# Patient Record
Sex: Female | Born: 1980 | Race: Black or African American | Hispanic: No | Marital: Single | Smoking: Never smoker
Health system: Southern US, Community
[De-identification: ages and names within clinical notes are randomized; demographics above are authoritative.]

## PROBLEM LIST (undated history)

## (undated) ENCOUNTER — Inpatient Hospital Stay (HOSPITAL_COMMUNITY): Payer: Self-pay

## (undated) DIAGNOSIS — D649 Anemia, unspecified: Secondary | ICD-10-CM

## (undated) DIAGNOSIS — Z9289 Personal history of other medical treatment: Secondary | ICD-10-CM

## (undated) DIAGNOSIS — I10 Essential (primary) hypertension: Secondary | ICD-10-CM

## (undated) DIAGNOSIS — K219 Gastro-esophageal reflux disease without esophagitis: Secondary | ICD-10-CM

## (undated) HISTORY — DX: Essential (primary) hypertension: I10

## (undated) HISTORY — PX: THERAPEUTIC ABORTION: SHX798

---

## 2003-12-06 ENCOUNTER — Emergency Department (HOSPITAL_COMMUNITY): Admission: EM | Admit: 2003-12-06 | Discharge: 2003-12-06 | Payer: Self-pay | Admitting: Emergency Medicine

## 2015-03-12 LAB — OB RESULTS CONSOLE ANTIBODY SCREEN: Antibody Screen: NEGATIVE

## 2015-03-12 LAB — OB RESULTS CONSOLE RPR: RPR: NONREACTIVE

## 2015-03-12 LAB — OB RESULTS CONSOLE HIV ANTIBODY (ROUTINE TESTING): HIV: NONREACTIVE

## 2015-03-12 LAB — OB RESULTS CONSOLE GC/CHLAMYDIA
Chlamydia: NEGATIVE
GC PROBE AMP, GENITAL: NEGATIVE

## 2015-03-12 LAB — OB RESULTS CONSOLE ABO/RH: RH TYPE: POSITIVE

## 2015-03-12 LAB — OB RESULTS CONSOLE RUBELLA ANTIBODY, IGM: Rubella: IMMUNE

## 2015-03-12 LAB — OB RESULTS CONSOLE HEPATITIS B SURFACE ANTIGEN: Hepatitis B Surface Ag: NEGATIVE

## 2015-03-26 ENCOUNTER — Ambulatory Visit (INDEPENDENT_AMBULATORY_CARE_PROVIDER_SITE_OTHER): Payer: 59 | Admitting: Family Medicine

## 2015-03-26 VITALS — BP 138/76 | HR 97 | Temp 98.8°F | Resp 18 | Ht 69.0 in | Wt 242.0 lb

## 2015-03-26 DIAGNOSIS — Z3201 Encounter for pregnancy test, result positive: Secondary | ICD-10-CM | POA: Diagnosis not present

## 2015-03-26 DIAGNOSIS — G43A0 Cyclical vomiting, not intractable: Secondary | ICD-10-CM

## 2015-03-26 DIAGNOSIS — R1115 Cyclical vomiting syndrome unrelated to migraine: Secondary | ICD-10-CM

## 2015-03-26 DIAGNOSIS — R109 Unspecified abdominal pain: Secondary | ICD-10-CM

## 2015-03-26 DIAGNOSIS — Z3481 Encounter for supervision of other normal pregnancy, first trimester: Secondary | ICD-10-CM

## 2015-03-26 LAB — POCT CBC
Granulocyte percent: 57.1 %G (ref 37–80)
HCT, POC: 36.9 % — AB (ref 37.7–47.9)
Hemoglobin: 11.5 g/dL — AB (ref 12.2–16.2)
Lymph, poc: 4.1 — AB (ref 0.6–3.4)
MCH, POC: 23 pg — AB (ref 27–31.2)
MCHC: 31.1 g/dL — AB (ref 31.8–35.4)
MCV: 73.9 fL — AB (ref 80–97)
MID (cbc): 0.2 (ref 0–0.9)
MPV: 7.6 fL (ref 0–99.8)
POC Granulocyte: 5.7 (ref 2–6.9)
POC LYMPH PERCENT: 41.4 %L (ref 10–50)
POC MID %: 1.5 %M (ref 0–12)
Platelet Count, POC: 313 10*3/uL (ref 142–424)
RBC: 4.99 M/uL (ref 4.04–5.48)
RDW, POC: 14.5 %
WBC: 10 10*3/uL (ref 4.6–10.2)

## 2015-03-26 LAB — POCT URINALYSIS DIPSTICK
Bilirubin, UA: NEGATIVE
Glucose, UA: NEGATIVE
Ketones, UA: NEGATIVE
Leukocytes, UA: NEGATIVE
Nitrite, UA: NEGATIVE
Protein, UA: NEGATIVE
Spec Grav, UA: >=1.03
Urobilinogen, UA: 0.2
pH, UA: 5.5

## 2015-03-26 LAB — POCT URINE PREGNANCY: Preg Test, Ur: POSITIVE — AB

## 2015-03-26 LAB — POCT UA - MICROSCOPIC ONLY
Casts, Ur, LPF, POC: NEGATIVE
Crystals, Ur, HPF, POC: NEGATIVE
Yeast, UA: NEGATIVE

## 2015-03-26 MED ORDER — ONDANSETRON 4 MG PO TBDP: 8.0000 mg | ORAL_TABLET | Freq: Once | ORAL | Status: DC

## 2015-03-26 MED ORDER — ONDANSETRON 8 MG PO TBDP: 8.0000 mg | ORAL_TABLET | Freq: Three times a day (TID) | ORAL | Status: DC | PRN

## 2015-03-26 MED ORDER — RANITIDINE HCL 150 MG PO TABS: 150.0000 mg | ORAL_TABLET | Freq: Two times a day (BID) | ORAL | Status: DC

## 2015-03-26 NOTE — Progress Notes (Addendum)
This chart was scribed for [name], MD by Stann Ore, medical scribe at [facility].The patient was seen in exam room 4 and the patient's care was started at 8:45 PM.  Patient ID: [name] MRN: [id number], DOB: [birth year], 34 y.o. Date of Encounter: 03/26/2015  Primary Physician: No primary care provider on file.  Chief Complaint:  Chief Complaint  Patient presents with  . Abdominal Pain    started last friday   . Sore Throat  . Headache  . Emesis    HPI:  [name] is a 34 y.o. female who presents to [facility] complaining of multiple concerns.  Pt complains bad abdominal pains that started since last week. She noticed that she can't hold anything down and would vomit a lot with some sore throat. She went to a [place] buffet last week, and since then, she's noticed the pains. She's also noticed some decreased urination. She denies trying zantac. She denies checking for fever. She denies trouble swallowing and vaginal bleeding.   She is 13 weeks into her pregnancy. Her current OB is in [place] and they told her to come to an Urgent Care.  She moved here to [place] recently.   Past Medical History  Diagnosis Date  . Hypertension      Home Meds: Prior to Admission medications   Medication Sig Start Date End Date Taking? Authorizing Provider  Cholecalciferol (VITAMIN D PO) Take by mouth.   Yes Historical Provider, MD    Allergies: No Known Allergies  Social History   Social History  . Marital Status: Single    Spouse Name: N/A  . Number of Children: N/A  . Years of Education: N/A   Occupational History  . Not on file.   Social History Main Topics  . Smoking status: Never Smoker   . Smokeless tobacco: Not on file  . Alcohol Use: No  . Drug Use: No  . Sexual Activity:    Partners: Male   Other Topics Concern  . Not on file   Social History Narrative  . No narrative on file     Review of  Systems: Constitutional: negative for chills, fever, night sweats, weight changes; positive for fatigue  HEENT: negative for vision changes, hearing loss, congestion, rhinorrhea, ST, epistaxis, or sinus pressure Cardiovascular: negative for chest pain or palpitations Respiratory: negative for hemoptysis, wheezing, shortness of breath, or cough Abdominal: negative for nausea, diarrhea, or constipation; positive for abdominal pain, vomiting  Dermatological: negative for rash GU: negative for vaginal bleeding; positive for decreased urination Neurologic: negative for headache, dizziness, or syncope All other systems reviewed and are otherwise negative with the exception to those above and in the HPI.  Physical Exam: Blood pressure 138/76, pulse 97, temperature 98.8 F (37.1 C), temperature source Oral, resp. rate 18, height  (1.753 m), weight 242 lb (109.77 kg), SpO2 98 %., Body mass index is 35.72 kg/(m^2). General: Well developed, well nourished, in no acute distress. Head: Normocephalic, atraumatic, eyes without discharge, sclera non-icteric, nares are without discharge. Bilateral auditory canals clear, TM's are without perforation, pearly grey and translucent with reflective cone of light bilaterally. Oral cavity moist, posterior pharynx without exudate, erythema, peritonsillar abscess, or post nasal drip.  Neck: Supple. No thyromegaly. Full ROM. No lymphadenopathy. Lungs: Clear bilaterally to auscultation without wheezes, rales, or rhonchi. Breathing is unlabored. Heart: RRR with S1 S2. No murmurs, rubs, or gallops appreciated. Abdomen: Soft, non-tender, non-distended with normoactive bowel sounds.  No hepatomegaly. No rebound/guarding. No obvious abdominal masses. Msk:  Strength and tone normal for age. Extremities/Skin: Warm and dry. No clubbing or cyanosis. No edema. No rashes or suspicious lesions. Neuro: Alert and oriented X 3. Moves all extremities spontaneously. Gait is normal.  CNII-XII grossly in tact. Psych:  Responds to questions appropriately with a normal affect.   Labs: Results for orders placed or performed in visit on 03/26/15  POCT CBC  Result Value Ref Range   WBC 10.0 4.6 - 10.2 K/uL   Lymph, poc 4.1 (A) 0.6 - 3.4   POC LYMPH PERCENT 41.4 10 - 50 %L   MID (cbc) 0.2 0 - 0.9   POC MID % 1.5 0 - 12 %M   POC Granulocyte 5.7 2 - 6.9   Granulocyte percent 57.1 37 - 80 %G   RBC 4.99 4.04 - 5.48 M/uL   Hemoglobin 11.5 (A) 12.2 - 16.2 g/dL   HCT, POC 16.1 (A) 09.6 - 47.9 %   MCV 73.9 (A) 80 - 97 fL   MCH, POC 23.0 (A) 27 - 31.2 pg   MCHC 31.1 (A) 31.8 - 35.4 g/dL   RDW, POC 04.5 %   Platelet Count, POC 313 142 - 424 K/uL   MPV 7.6 0 - 99.8 fL  POCT urinalysis dipstick  Result Value Ref Range   Color, UA yellow    Clarity, UA cloudy    Glucose, UA neg    Bilirubin, UA neg    Ketones, UA neg    Spec Grav, UA >=1.030    Blood, UA trace    pH, UA 5.5    Protein, UA neg    Urobilinogen, UA 0.2    Nitrite, UA neg    Leukocytes, UA Negative Negative  POCT urine pregnancy  Result Value Ref Range   Preg Test, Ur Positive (A) Negative  POCT UA - Microscopic Only  Result Value Ref Range   WBC, Ur, HPF, POC 4-8    RBC, urine, microscopic 0-2    Bacteria, U Microscopic 2+    Mucus, UA trace    Epithelial cells, urine per micros 5-8    Crystals, Ur, HPF, POC neg    Casts, Ur, LPF, POC neg    Yeast, UA neg      ASSESSMENT AND PLAN:  34 y.o. year old female with  This chart was scribed in my presence and reviewed by me personally.    ICD-9-CM ICD-10-CM   1. Abdominal cramps 789.00 R10.9 POCT CBC     POCT urinalysis dipstick     POCT UA - Microscopic Only     ondansetron (ZOFRAN-ODT) 8 MG disintegrating tablet     ranitidine (ZANTAC) 150 MG tablet     Urine culture  2. Encounter for supervision of other normal pregnancy in first trimester V22.1 Z34.81 POCT urine pregnancy     POCT UA - Microscopic Only     ondansetron (ZOFRAN-ODT) 8 MG  disintegrating tablet     ranitidine (ZANTAC) 150 MG tablet  3. Non-intractable cyclical vomiting with nausea 536.2 G43.A0 ondansetron (ZOFRAN-ODT) disintegrating tablet 8 mg     POCT UA - Microscopic Only     ondansetron (ZOFRAN-ODT) 8 MG disintegrating tablet     ranitidine (ZANTAC) 150 MG tablet    Signed, [name], MD 03/26/2015 9:06 PM

## 2015-03-28 LAB — URINE CULTURE
Colony Count: NO GROWTH
Organism ID, Bacteria: NO GROWTH

## 2015-04-21 ENCOUNTER — Encounter (HOSPITAL_COMMUNITY): Payer: Self-pay | Admitting: *Deleted

## 2015-04-21 ENCOUNTER — Inpatient Hospital Stay (HOSPITAL_COMMUNITY)
Admission: AD | Admit: 2015-04-21 | Discharge: 2015-04-21 | Disposition: A | Discharge status: Home / Self Care | Payer: 59 | Source: Ambulatory Visit | Attending: Obstetrics and Gynecology | Admitting: Obstetrics and Gynecology

## 2015-04-21 DIAGNOSIS — Z3A16 16 weeks gestation of pregnancy: Secondary | ICD-10-CM | POA: Diagnosis not present

## 2015-04-21 DIAGNOSIS — R51 Headache: Secondary | ICD-10-CM | POA: Insufficient documentation

## 2015-04-21 DIAGNOSIS — R519 Headache, unspecified: Secondary | ICD-10-CM

## 2015-04-21 DIAGNOSIS — R112 Nausea with vomiting, unspecified: Secondary | ICD-10-CM | POA: Diagnosis present

## 2015-04-21 DIAGNOSIS — O219 Vomiting of pregnancy, unspecified: Secondary | ICD-10-CM

## 2015-04-21 DIAGNOSIS — O26892 Other specified pregnancy related conditions, second trimester: Secondary | ICD-10-CM | POA: Insufficient documentation

## 2015-04-21 DIAGNOSIS — I10 Essential (primary) hypertension: Secondary | ICD-10-CM | POA: Diagnosis not present

## 2015-04-21 LAB — URINALYSIS, ROUTINE W REFLEX MICROSCOPIC
Bilirubin Urine: NEGATIVE
GLUCOSE, UA: NEGATIVE mg/dL
Hgb urine dipstick: NEGATIVE
KETONES UR: NEGATIVE mg/dL
LEUKOCYTES UA: NEGATIVE
NITRITE: NEGATIVE
PH: 6 (ref 5.0–8.0)
Protein, ur: NEGATIVE mg/dL
Urobilinogen, UA: 0.2 mg/dL (ref 0.0–1.0)

## 2015-04-21 MED ORDER — PROMETHAZINE HCL 25 MG PO TABS: 12.5000 mg | ORAL_TABLET | Freq: Four times a day (QID) | ORAL | Status: DC | PRN

## 2015-04-21 MED ORDER — DEXTROSE IN LACTATED RINGERS 5 % IV SOLN
25.0000 mg | Freq: Once | INTRAVENOUS | Status: AC
  Administered 2015-04-21: 25 mg via INTRAVENOUS
  Filled 2015-04-21: qty 1

## 2015-04-21 NOTE — Discharge Instructions (Signed)

## 2015-04-21 NOTE — MAU Note (Signed)
Pt states she has been vomiting for the last 2 days, also reports a headache for the last 2 days.

## 2015-04-21 NOTE — MAU Provider Note (Signed)
History     CSN: [id number]  Arrival date and time: 04/21/15 0040   First Provider Initiated Contact with Patient 04/21/15 0136      No chief complaint on file.  HPI Comments: [name] is a 34 y.o. [id number] at [redacted]w[redacted]d who presents today with nausea/vomiting/headache. She states that she has been given zofran, but she has not been taking. She took tylenol about 5 hours ago, but it didn't help. She states that she was seen in the ED in [facility], and was given IV meds for a similar headache a few weeks ago. She states that it worked, but she doesn't remember what the medication was.   Emesis  This is a new problem. The current episode started more than 1 month ago. The problem occurs 2 to 4 times per day. The problem has been unchanged. The emesis has an appearance of stomach contents. There has been no fever. Associated symptoms include headaches. Pertinent negatives include no abdominal pain, diarrhea or fever. Risk factors: pregnancy  She has tried nothing for the symptoms.    Past Medical History  Diagnosis Date  . Hypertension     Past Surgical History  Procedure Laterality Date  . Cesarean section    . Therapeutic abortion      X4    Family History  Problem Relation Age of Onset  . Diabetes Mother   . Hypertension Mother   . Hypertension Sister     Social History  Substance Use Topics  . Smoking status: Never Smoker   . Smokeless tobacco: None  . Alcohol Use: No    Allergies: No Known Allergies  Facility-administered medications prior to admission  Medication Dose Route Frequency Provider Last Rate Last Dose  . ondansetron (ZOFRAN-ODT) disintegrating tablet 8 mg  8 mg Oral Once [name], MD       Prescriptions prior to admission  Medication Sig Dispense Refill Last Dose  . Cholecalciferol (VITAMIN D PO) Take by mouth.   04/20/2015 at 1400  . ondansetron (ZOFRAN-ODT) 8 MG disintegrating tablet Take 1 tablet (8 mg total) by mouth every 8 (eight) hours  as needed for nausea. 30 tablet 0 Past Month at Unknown time  . ranitidine (ZANTAC) 150 MG tablet Take 1 tablet (150 mg total) by mouth 2 (two) times daily. 30 tablet 0 04/20/2015 at 1400    Review of Systems  Constitutional: Negative for fever.  Gastrointestinal: Positive for nausea and vomiting. Negative for abdominal pain, diarrhea and constipation.  Genitourinary: Negative for dysuria, urgency and frequency.  Neurological: Positive for headaches.   Physical Exam   Blood pressure 133/72, pulse 81, temperature 98.3 F (36.8 C), temperature source Oral, resp. rate 16, height  (1.753 m), weight 111.131 kg (245 lb), last menstrual period 12/27/2014, SpO2 100 %.  Physical Exam  Nursing note and vitals reviewed. Constitutional: She is oriented to person, place, and time. She appears well-developed and well-nourished. No distress.  HENT:  Head: Normocephalic.  Cardiovascular: Normal rate.   Respiratory: Effort normal.  GI: Soft. There is no tenderness.  Neurological: She is alert and oriented to person, place, and time.  Skin: Skin is warm and dry.  Psychiatric: She has a normal mood and affect.   Results for orders placed or performed during the hospital encounter of 04/21/15 (from the past 24 hour(s))  Urinalysis, Routine w reflex microscopic (not at [facility])     Status: Abnormal   Collection Time: 04/21/15 12:55 AM  Result Value Ref Range  Color, Urine YELLOW YELLOW   APPearance CLEAR CLEAR   Specific Gravity, Urine >1.030 (H) 1.005 - 1.030   pH 6.0 5.0 - 8.0   Glucose, UA NEGATIVE NEGATIVE mg/dL   Hgb urine dipstick NEGATIVE NEGATIVE   Bilirubin Urine NEGATIVE NEGATIVE   Ketones, ur NEGATIVE NEGATIVE mg/dL   Protein, ur NEGATIVE NEGATIVE mg/dL   Urobilinogen, UA 0.2 0.0 - 1.0 mg/dL   Nitrite NEGATIVE NEGATIVE   Leukocytes, UA NEGATIVE NEGATIVE    MAU Course  Procedures  MDM Patient has had 1L D5LR and phenergan. She states that her headache and nausea have improved.  She is tolerating PO   Assessment and Plan   1. Nausea/vomiting in pregnancy   2. Acute nonintractable headache, unspecified headache type    DC home Comfort measures reviewed  2nd Trimester precautions  RX: phenergan PRN #[id number]  Return to MAU as needed   Follow-up Information    Follow up with Meriel Pica, MD.   Specialty:  Obstetrics and Gynecology   Why:  As scheduled   Contact information:   [address] [place] [address] [phone]         Tawnya Crook 04/21/2015, 1:37 AM

## 2015-06-23 ENCOUNTER — Inpatient Hospital Stay (HOSPITAL_COMMUNITY): Payer: 59

## 2015-06-23 ENCOUNTER — Encounter (HOSPITAL_COMMUNITY): Payer: Self-pay | Admitting: *Deleted

## 2015-06-23 ENCOUNTER — Inpatient Hospital Stay (HOSPITAL_COMMUNITY)
Admission: AD | Admit: 2015-06-23 | Discharge: 2015-06-23 | Disposition: A | Discharge status: Home / Self Care | Payer: 59 | Source: Ambulatory Visit | Attending: Obstetrics and Gynecology | Admitting: Obstetrics and Gynecology

## 2015-06-23 DIAGNOSIS — R51 Headache: Secondary | ICD-10-CM

## 2015-06-23 DIAGNOSIS — I10 Essential (primary) hypertension: Secondary | ICD-10-CM

## 2015-06-23 DIAGNOSIS — O10912 Unspecified pre-existing hypertension complicating pregnancy, second trimester: Secondary | ICD-10-CM | POA: Diagnosis not present

## 2015-06-23 DIAGNOSIS — R002 Palpitations: Secondary | ICD-10-CM | POA: Insufficient documentation

## 2015-06-23 DIAGNOSIS — R0602 Shortness of breath: Secondary | ICD-10-CM | POA: Diagnosis not present

## 2015-06-23 DIAGNOSIS — Z3A25 25 weeks gestation of pregnancy: Secondary | ICD-10-CM | POA: Insufficient documentation

## 2015-06-23 DIAGNOSIS — R519 Headache, unspecified: Secondary | ICD-10-CM

## 2015-06-23 LAB — COMPREHENSIVE METABOLIC PANEL
ALT: 24 U/L (ref 14–54)
AST: 21 U/L (ref 15–41)
Albumin: 2.8 g/dL — ABNORMAL LOW (ref 3.5–5.0)
Alkaline Phosphatase: 77 U/L (ref 38–126)
Anion gap: 6 (ref 5–15)
BUN: 9 mg/dL (ref 6–20)
CO2: 24 mmol/L (ref 22–32)
Calcium: 8.9 mg/dL (ref 8.9–10.3)
Chloride: 104 mmol/L (ref 101–111)
Creatinine, Ser: 0.55 mg/dL (ref 0.44–1.00)
GFR calc Af Amer: >60 mL/min (ref >60)
GFR calc non Af Amer: >60 mL/min (ref >60)
Glucose, Bld: 86 mg/dL (ref 65–99)
Potassium: 3.8 mmol/L (ref 3.5–5.1)
Sodium: 134 mmol/L — ABNORMAL LOW (ref 135–145)
Total Bilirubin: 0.4 mg/dL (ref 0.3–1.2)
Total Protein: 6.7 g/dL (ref 6.5–8.1)

## 2015-06-23 LAB — CBC
HCT: 35.4 % — ABNORMAL LOW (ref 36.0–46.0)
Hemoglobin: 11.4 g/dL — ABNORMAL LOW (ref 12.0–15.0)
MCH: 23.2 pg — ABNORMAL LOW (ref 26.0–34.0)
MCHC: 32.2 g/dL (ref 30.0–36.0)
MCV: 72.1 fL — ABNORMAL LOW (ref 78.0–100.0)
Platelets: 210 10*3/uL (ref 150–400)
RBC: 4.91 MIL/uL (ref 3.87–5.11)
RDW: 13.8 % (ref 11.5–15.5)
WBC: 10.1 10*3/uL (ref 4.0–10.5)

## 2015-06-23 LAB — URINALYSIS, ROUTINE W REFLEX MICROSCOPIC
BILIRUBIN URINE: NEGATIVE
Glucose, UA: NEGATIVE mg/dL
Hgb urine dipstick: NEGATIVE
KETONES UR: 15 mg/dL — AB
Leukocytes, UA: NEGATIVE
Nitrite: NEGATIVE
PH: 6.5 (ref 5.0–8.0)
PROTEIN: NEGATIVE mg/dL
Specific Gravity, Urine: 1.025 (ref 1.005–1.030)

## 2015-06-23 LAB — PROTEIN / CREATININE RATIO, URINE
Creatinine, Urine: 84 mg/dL
Protein Creatinine Ratio: 0.08 mg/mg{Cre} (ref 0.00–0.15)
Total Protein, Urine: 7 mg/dL

## 2015-06-23 MED ORDER — BUTALBITAL-APAP-CAFFEINE 50-325-40 MG PO TABS
1.0000 | ORAL_TABLET | Freq: Once | ORAL | Status: AC
  Administered 2015-06-23: 1 via ORAL
  Filled 2015-06-23: qty 1

## 2015-06-23 NOTE — MAU Note (Signed)
Sent from MD office for BP elevation & SOB, feels like heart is racing.  Pt states her BP was elevated yesterday & this morning @ home, was normal in MD office.  Has had HA since yesterday.  Has braxton hicks uc's, denies bleeding or LOF.

## 2015-06-23 NOTE — MAU Provider Note (Signed)
History     CSN: [id number]  Arrival date and time: 06/23/15 1525   First Provider Initiated Contact with Patient 06/23/15 1705      Chief Complaint  Patient presents with  . Hypertension  . Shortness of Breath   Hypertension Associated symptoms include headaches, palpitations and shortness of breath.  Shortness of Breath Associated symptoms include headaches.    [name] 34 y.o. [id number] with a history of chronic hypertension on medication, labetolol 100mg  BID, presents to the MAU from the office for evaluation of SOB, and palpitations. She also reports a headache for 3 days.She is to have PRe- E labs today.  Past Medical History  Diagnosis Date  . Hypertension     Past Surgical History  Procedure Laterality Date  . Cesarean section    . Therapeutic abortion      X4    Family History  Problem Relation Age of Onset  . Diabetes Mother   . Hypertension Mother   . Hypertension Sister     Social History  Substance Use Topics  . Smoking status: Never Smoker   . Smokeless tobacco: None  . Alcohol Use: No    Allergies: No Known Allergies  Facility-administered medications prior to admission  Medication Dose Route Frequency Provider Last Rate Last Dose  . ondansetron (ZOFRAN-ODT) disintegrating tablet 8 mg  8 mg Oral Once Elvina SidleKurt Lauenstein, MD       Prescriptions prior to admission  Medication Sig Dispense Refill Last Dose  . acetaminophen (TYLENOL) 500 MG tablet Take 1,000 mg by mouth every 6 (six) hours as needed for mild pain or headache.   06/23/2015 at Unknown time  . Cholecalciferol (VITAMIN D) 2000 UNITS CAPS Take 2,000 Units by mouth daily.   06/23/2015 at Unknown time  . Fe Cbn-Fe Gluc-FA-B12-C-DSS (FERRALET 90 PO) Take 1 tablet by mouth daily.   06/23/2015 at Unknown time  . labetalol (NORMODYNE) 200 MG tablet Take 200 mg by mouth 2 (two) times daily.   06/23/2015 at 1200  . Omeprazole (PRILOSEC PO) Take 1 tablet by mouth daily.   06/23/2015 at  Unknown time  . Prenatal Vit-Min-FA-Fish Oil ([employer] PRENATAL GUMMY PO) Take 2 tablets by mouth daily.   06/23/2015 at Unknown time    Review of Systems  Respiratory: Positive for shortness of breath.   Cardiovascular: Positive for palpitations.  Neurological: Positive for headaches.   Physical Exam   Blood pressure 143/77, pulse 87, temperature 98.5 F (36.9 C), temperature source Oral, resp. rate 20, last menstrual period 12/27/2014, SpO2 100 %.  Physical Exam  Nursing note and vitals reviewed. Constitutional: She is oriented to person, place, and time. She appears well-developed and well-nourished. No distress.  HENT:  Head: Normocephalic and atraumatic.  Neck: Normal range of motion.  Cardiovascular: Normal rate and regular rhythm.   Respiratory: Effort normal and breath sounds normal. No respiratory distress.  GI: Soft. There is no tenderness.  Musculoskeletal: Normal range of motion.  Neurological: She is alert and oriented to person, place, and time.  Skin: Skin is warm and dry.  Psychiatric: She has a normal mood and affect. Her behavior is normal. Judgment and thought content normal.    Results for orders placed or performed during the hospital encounter of 06/23/15 (from the past 24 hour(s))  Urinalysis, Routine w reflex microscopic (not at [facility])     Status: Abnormal   Collection Time: 06/23/15  3:40 PM  Result Value Ref Range   Color, Urine YELLOW YELLOW  APPearance CLEAR CLEAR   Specific Gravity, Urine 1.025 1.005 - 1.030   pH 6.5 5.0 - 8.0   Glucose, UA NEGATIVE NEGATIVE mg/dL   Hgb urine dipstick NEGATIVE NEGATIVE   Bilirubin Urine NEGATIVE NEGATIVE   Ketones, ur 15 (A) NEGATIVE mg/dL   Protein, ur NEGATIVE NEGATIVE mg/dL   Nitrite NEGATIVE NEGATIVE   Leukocytes, UA NEGATIVE NEGATIVE  Protein / creatinine ratio, urine     Status: None   Collection Time: 06/23/15  3:40 PM  Result Value Ref Range   Creatinine, Urine 84.00 mg/dL   Total Protein, Urine 7 mg/dL    Protein Creatinine Ratio 0.08 0.00 - 0.15 mg/mg[Cre]  CBC     Status: Abnormal   Collection Time: 06/23/15  5:14 PM  Result Value Ref Range   WBC 10.1 4.0 - 10.5 K/uL   RBC 4.91 3.87 - 5.11 MIL/uL   Hemoglobin 11.4 (L) 12.0 - 15.0 g/dL   HCT 96.0 (L) 45.4 - 09.8 %   MCV 72.1 (L) 78.0 - 100.0 fL   MCH 23.2 (L) 26.0 - 34.0 pg   MCHC 32.2 30.0 - 36.0 g/dL   RDW 11.9 14.7 - 82.9 %   Platelets 210 150 - 400 K/uL  Comprehensive metabolic panel     Status: Abnormal   Collection Time: 06/23/15  5:14 PM  Result Value Ref Range   Sodium 134 (L) 135 - 145 mmol/L   Potassium 3.8 3.5 - 5.1 mmol/L   Chloride 104 101 - 111 mmol/L   CO2 24 22 - 32 mmol/L   Glucose, Bld 86 65 - 99 mg/dL   BUN 9 6 - 20 mg/dL   Creatinine, Ser 5.62 0.44 - 1.00 mg/dL   Calcium 8.9 8.9 - 13.0 mg/dL   Total Protein 6.7 6.5 - 8.1 g/dL   Albumin 2.8 (L) 3.5 - 5.0 g/dL   AST 21 15 - 41 U/L   ALT 24 14 - 54 U/L   Alkaline Phosphatase 77 38 - 126 U/L   Total Bilirubin 0.4 0.3 - 1.2 mg/dL   GFR calc non Af Amer >60 >60 mL/min   GFR calc Af Amer >60 >60 mL/min   Anion gap 6 5 - 15  CLINICAL DATA: Elevated blood pressure. Shortness of breath. [redacted] weeks pregnant.  EXAM: CHEST 2 VIEW  COMPARISON: None.  FINDINGS: The heart size and mediastinal contours are within normal limits. Both lungs are clear. The visualized skeletal structures are unremarkable.  IMPRESSION: No active cardiopulmonary disease.   Electronically Signed  By: Signa Kell M.D.  On: 06/23/2015 17:44  MAU Course  Procedures  MDM Based on nml lab results, Chest Xray, pt will be discharge tohome and is to wait for office to call her tomorrow for a cardiology consult. Her headache is gone after taking the fioricet and she was advised to take regular tylenol if she needs anything for headache after she is home. FHR- Cat 1, no contractions. Dr Henderson Cloud informed and aware of pt results.  Assessment and Plan   Headache Palpitations  Discharge to home  [facility] 06/23/2015, 6:40 PM

## 2015-06-23 NOTE — Discharge Instructions (Signed)
Palpitations A palpitation is the feeling that your heartbeat is irregular or is faster than normal. It may feel like your heart is fluttering or skipping a beat. Palpitations are usually not a serious problem. However, in some cases, you may need further medical evaluation. CAUSES  Palpitations can be caused by:  Smoking.  Caffeine or other stimulants, such as diet pills or energy drinks.  Alcohol.  Stress and anxiety.  Strenuous physical activity.  Fatigue.  Certain medicines.  Heart disease, especially if you have a history of irregular heart rhythms (arrhythmias), such as atrial fibrillation, atrial flutter, or supraventricular tachycardia.  An improperly working pacemaker or defibrillator. DIAGNOSIS  To find the cause of your palpitations, your health care provider will take your medical history and perform a physical exam. Your health care provider may also have you take a test called an ambulatory electrocardiogram (ECG). An ECG records your heartbeat patterns over a 24-hour period. You may also have other tests, such as:  Transthoracic echocardiogram (TTE). During echocardiography, sound waves are used to evaluate how blood flows through your heart.  Transesophageal echocardiogram (TEE).  Cardiac monitoring. This allows your health care provider to monitor your heart rate and rhythm in real time.  Holter monitor. This is a portable device that records your heartbeat and can help diagnose heart arrhythmias. It allows your health care provider to track your heart activity for several days, if needed.  Stress tests by exercise or by giving medicine that makes the heart beat faster. TREATMENT  Treatment of palpitations depends on the cause of your symptoms and can vary greatly. Most cases of palpitations do not require any treatment other than time, relaxation, and monitoring your symptoms. Other causes, such as atrial fibrillation, atrial flutter, or supraventricular  tachycardia, usually require further treatment. HOME CARE INSTRUCTIONS   Avoid:  Caffeinated coffee, tea, soft drinks, diet pills, and energy drinks.  Chocolate.  Alcohol.  Stop smoking if you smoke.  Reduce your stress and anxiety. Things that can help you relax include:  A method of controlling things in your body, such as your heartbeats, with your mind (biofeedback).  Yoga.  Meditation.  Physical activity such as swimming, jogging, or walking.  Get plenty of rest and sleep. SEEK MEDICAL CARE IF:   You continue to have a fast or irregular heartbeat beyond 24 hours.  Your palpitations occur more often. SEEK IMMEDIATE MEDICAL CARE IF:  You have chest pain or shortness of breath.  You have a severe headache.  You feel dizzy or you faint. MAKE SURE YOU:  Understand these instructions.  Will watch your condition.  Will get help right away if you are not doing well or get worse.   This information is not intended to replace advice given to you by your health care provider. Make sure you discuss any questions you have with your health care provider.   Document Released: 07/01/2000 Document Revised: 07/09/2013 Document Reviewed: 09/02/2011 Elsevier Interactive Patient Education 2016 Elsevier Inc[employer] General Headache Without Cause A headache is pain or discomfort felt around the head or neck area. The specific cause of a headache may not be found. There are many causes and types of headaches. A few common ones are:  Tension headaches.  Migraine headaches.  Cluster headaches.  Chronic daily headaches. HOME CARE INSTRUCTIONS  Watch your condition for any changes. Take these steps to help with your condition: Managing Pain  Take over-the-counter and prescription medicines only as told by your health care provider.  Lorenz CoasterLie  down in a dark, quiet room when you have a headache.  If directed, apply ice to the head and neck area:  Put ice in a plastic bag.  Place a  towel between your skin and the bag.  Leave the ice on for 20 minutes, 2-3 times per day.  Use a heating pad or hot shower to apply heat to the head and neck area as told by your health care provider.  Keep lights dim if bright lights bother you or make your headaches worse. Eating and Drinking  Eat meals on a regular schedule.  Limit alcohol use.  Decrease the amount of caffeine you drink, or stop drinking caffeine. General Instructions  Keep all follow-up visits as told by your health care provider. This is important.  Keep a headache journal to help find out what may trigger your headaches. For example, write down:  What you eat and drink.  How much sleep you get.  Any change to your diet or medicines.  Try massage or other relaxation techniques.  Limit stress.  Sit up straight, and do not tense your muscles.  Do not use tobacco products, including cigarettes, chewing tobacco, or e-cigarettes. If you need help quitting, ask your health care provider.  Exercise regularly as told by your health care provider.  Sleep on a regular schedule. Get 7-9 hours of sleep, or the amount recommended by your health care provider. SEEK MEDICAL CARE IF:   Your symptoms are not helped by medicine.  You have a headache that is different from the usual headache.  You have nausea or you vomit.  You have a fever. SEEK IMMEDIATE MEDICAL CARE IF:   Your headache becomes severe.  You have repeated vomiting.  You have a stiff neck.  You have a loss of vision.  You have problems with speech.  You have pain in the eye or ear.  You have muscular weakness or loss of muscle control.  You lose your balance or have trouble walking.  You feel faint or pass out.  You have confusion.   This information is not intended to replace advice given to you by your health care provider. Make sure you discuss any questions you have with your health care provider.   Document Released:  07/04/2005 Document Revised: 03/25/2015 Document Reviewed: 10/27/2014 Elsevier Interactive Patient Education [employer]

## 2015-06-26 ENCOUNTER — Telehealth: Payer: Self-pay | Admitting: *Deleted

## 2015-06-26 ENCOUNTER — Telehealth: Payer: Self-pay | Admitting: Cardiovascular Disease

## 2015-06-26 ENCOUNTER — Ambulatory Visit (INDEPENDENT_AMBULATORY_CARE_PROVIDER_SITE_OTHER): Payer: 59 | Admitting: Internal Medicine

## 2015-06-26 ENCOUNTER — Encounter: Payer: Self-pay | Admitting: Internal Medicine

## 2015-06-26 VITALS — BP 138/70 | HR 96 | Ht 69.0 in | Wt 250.0 lb

## 2015-06-26 DIAGNOSIS — R0602 Shortness of breath: Secondary | ICD-10-CM | POA: Diagnosis not present

## 2015-06-26 NOTE — Telephone Encounter (Signed)
Spoke with Cly[facility]Karen at [facility]. MD there requesting patient have same day cardiology appointment.  Pt hs been experiencing SOB, which has worsened this week, was in ED on 12/6 and had abnormal EKG. She is [redacted] weeks pregnant.  Spoke with Dr. Tenny Crawoss. Pt has been added on to DOD schedule today with Dr. Tenny Crawoss. Charna Busmandvised Karen at [facility], she will contact patient to inform.  She will also fax records to our office.

## 2015-06-26 NOTE — Telephone Encounter (Signed)
06/26/2015 Received faxed referral from [facility] for upcoming appointment with Dr. Duke Salviaandolph on 07/20/2014.  [facility]

## 2015-06-26 NOTE — Progress Notes (Signed)
Cardiology Office Note   Date:  06/26/2015   ID:  [name], DOB 09-Mar-1981, MRN [id number]  PCP:  No PCP Per Patient  Cardiologist:   Dietrich Pates, MD   Pt referred for SOB      History of Present Illness: [name] is a 34 y.o. female with a history of SOB  Started about 4 wks ago  Now [redacted] wks pregnant   Fe low   Has heavy feeling in chest at times  No history of asthma This is 3rd pregnancy  Last pregnancy was 10 years ago  No poblems with other 2  Prior to current pregnancy she felt good   At night OK lying  SOB is more when talking        Current Outpatient Prescriptions  Medication Sig Dispense Refill  . acetaminophen (TYLENOL) 500 MG tablet Take 1,000 mg by mouth every 6 (six) hours as needed for mild pain or headache.    . Cholecalciferol (VITAMIN D) 2000 UNITS CAPS Take 2,000 Units by mouth daily.    Marland Kitchen Fe Cbn-Fe Gluc-FA-B12-C-DSS (FERRALET 90 PO) Take 1 tablet by mouth daily.    Marland Kitchen labetalol (NORMODYNE) 200 MG tablet Take 200 mg by mouth 2 (two) times daily.    . Omeprazole (PRILOSEC PO) Take 1 tablet by mouth daily.    . Prenatal Vit-Min-FA-Fish Oil ([employer] PRENATAL GUMMY PO) Take 2 tablets by mouth daily.     No current facility-administered medications for this visit.    Allergies:   Review of patient's allergies indicates no known allergies.   Past Medical History  Diagnosis Date  . Hypertension     Past Surgical History  Procedure Laterality Date  . Cesarean section    . Therapeutic abortion      X4     Social History:  The patient  reports that she has never smoked. She does not have any smokeless tobacco history on file. She reports that she does not drink alcohol or use illicit drugs.   Family History:  The patient's family history includes Diabetes in her mother; Hypertension in her mother and sister.    ROS:  Please see the history of present illness. All other systems are reviewed and  Negative to the above problem except as noted.      PHYSICAL EXAM: VS:  BP 138/70 mmHg  Pulse 96  Ht  (1.753 m)  Wt 113.399 kg (250 lb)  BMI 36.90 kg/m2  SpO2 99%  LMP 12/27/2014  GEN: Well nourished, well developed, in no acute distress HEENT: normal Neck: no JVD, carotid bruits, or masses Cardiac: RRR; no murmurs, rubs, or gallops,no edema  Respiratory:  clear to auscultation bilaterally, normal work of breathing GI: soft, nontender, nondistended, + BS  No hepatomegaly  MS: no deformity Moving all extremities   Skin: warm and dry, no rash Neuro:  Strength and sensation are intact Psych: euthymic mood, full affect   EKG:  EKG is not ordered today.  On 12/6: SR 82 bpm  T wave inversion V1-V3   Lipid Panel No results found for: CHOL, TRIG, HDL, CHOLHDL, VLDL, LDLCALC, LDLDIRECT    Wt Readings from Last 3 Encounters:  06/26/15 113.399 kg (250 lb)  04/21/15 111.131 kg (245 lb)  03/26/15 109.77 kg (242 lb)      ASSESSMENT AND PLAN:  1.  Dypsnea./ chest pressure   Exam is normal  EKG may be nonspecific    May reflect increased demands with pregnancy I would  recomm echo to evaluate LV size and function.   Take activities as tolerated  F/U based on test results  2.  Abnormal EKG  May be her normal baseline  Will try to get old EKG  Echo as planned      Signed, Dietrich PatesPaula Ross, MD  06/26/2015 4:28 PM    [facility] [address], [place], [place]  [address] Phone: [phone]; Fax: [phone]

## 2015-06-26 NOTE — Patient Instructions (Signed)
Your physician recommends that you continue on your current medications as directed. Please refer to the Current Medication list given to you today. Your physician has requested that you have an echocardiogram. Echocardiography is a painless test that uses sound waves to create images of your heart. It provides your doctor with information about the size and shape of your heart and how well your heart's chambers and valves are working. This procedure takes approximately one hour. There are no restrictions for this procedure.  Your physician recommends that you schedule a follow-up appointment as needed with Dr. Tenny Crawoss.   We will obtain your previous EKG to compare them.

## 2015-06-26 NOTE — Telephone Encounter (Signed)
06/26/2015 ° °

## 2015-07-01 ENCOUNTER — Inpatient Hospital Stay (HOSPITAL_COMMUNITY)
Admission: AD | Admit: 2015-07-01 | Discharge: 2015-07-01 | Disposition: A | Discharge status: Home / Self Care | Payer: 59 | Source: Ambulatory Visit | Attending: Obstetrics and Gynecology | Admitting: Obstetrics and Gynecology

## 2015-07-01 ENCOUNTER — Encounter (HOSPITAL_COMMUNITY): Payer: Self-pay | Admitting: *Deleted

## 2015-07-01 DIAGNOSIS — Z3A26 26 weeks gestation of pregnancy: Secondary | ICD-10-CM | POA: Insufficient documentation

## 2015-07-01 DIAGNOSIS — M549 Dorsalgia, unspecified: Secondary | ICD-10-CM | POA: Diagnosis not present

## 2015-07-01 DIAGNOSIS — O26892 Other specified pregnancy related conditions, second trimester: Secondary | ICD-10-CM | POA: Insufficient documentation

## 2015-07-01 DIAGNOSIS — O99891 Other specified diseases and conditions complicating pregnancy: Secondary | ICD-10-CM

## 2015-07-01 DIAGNOSIS — O9989 Other specified diseases and conditions complicating pregnancy, childbirth and the puerperium: Secondary | ICD-10-CM

## 2015-07-01 LAB — URINALYSIS, ROUTINE W REFLEX MICROSCOPIC
Bilirubin Urine: NEGATIVE
Glucose, UA: NEGATIVE mg/dL
Hgb urine dipstick: NEGATIVE
KETONES UR: NEGATIVE mg/dL
LEUKOCYTES UA: NEGATIVE
NITRITE: NEGATIVE
PROTEIN: NEGATIVE mg/dL
Specific Gravity, Urine: 1.015 (ref 1.005–1.030)
pH: 6 (ref 5.0–8.0)

## 2015-07-01 NOTE — Discharge Instructions (Signed)
Back Injury Prevention Back injuries can be very painful. They can also be difficult to heal. After having one back injury, you are more likely to injure your back again. It is important to learn how to avoid injuring or re-injuring your back. The following tips can help you to prevent a back injury. WHAT SHOULD I KNOW ABOUT PHYSICAL FITNESS?  Exercise for 30 minutes per day on most days of the week or as directed by your health care provider. Make sure to:  Do aerobic exercises, such as walking, jogging, biking, or swimming.  Do exercises that increase balance and strength, such as tai chi and yoga. These can decrease your risk of falling and injuring your back.  Do stretching exercises to help with flexibility.  Try to develop strong abdominal muscles. Your abdominal muscles provide a lot of the support that is needed by your back.  Maintain a healthy weight. This helps to decrease your risk of a back injury. WHAT SHOULD I KNOW ABOUT MY DIET?  Talk with your health care provider about your overall diet. Take supplements and vitamins only as directed by your health care provider.  Talk with your health care provider about how much calcium and vitamin D you need each day. These nutrients help to prevent weakening of the bones (osteoporosis). Osteoporosis can cause broken (fractured) bones, which lead to back pain.  Include good sources of calcium in your diet, such as dairy products, green leafy vegetables, and products that have had calcium added to them (fortified).  Include good sources of vitamin D in your diet, such as milk and foods that are fortified with vitamin D. WHAT SHOULD I KNOW ABOUT MY POSTURE?  Sit up straight and stand up straight. Avoid leaning forward when you sit or hunching over when you stand.  Choose chairs that have good low-back (lumbar) support.  If you work at a desk, sit close to it so you do not need to lean over. Keep your chin tucked in. Keep your neck  drawn back, and keep your elbows bent at a right angle. Your arms should look like the letter "L."  Sit high and close to the steering wheel when you drive. Add a lumbar support to your car seat, if needed.  Avoid sitting or standing in one position for very long. Take breaks to get up, stretch, and walk around at least one time every hour. Take breaks every hour if you are driving for long periods of time.  Sleep on your side with your knees slightly bent, or sleep on your back with a pillow under your knees. Do not lie on the front of your body to sleep. WHAT SHOULD I KNOW ABOUT LIFTING, TWISTING, AND REACHING? Lifting and Heavy Lifting  Avoid heavy lifting, especially repetitive heavy lifting. If you must do heavy lifting:  Stretch before lifting.  Work slowly.  Rest between lifts.  Use a tool such as a cart or a dolly to move objects if one is available.  Make several small trips instead of carrying one heavy load.  Ask for help when you need it, especially when moving big objects.  Follow these steps when lifting:  Stand with your feet shoulder-width apart.  Get as close to the object as you can. Do not try to pick up a heavy object that is far from your body.  Use handles or lifting straps if they are available.  Bend at your knees. Squat down, but keep your heels off the floor.  Keep your shoulders pulled back, your chin tucked in, and your back straight.  Lift the object slowly while you tighten the muscles in your legs, abdomen, and buttocks. Keep the object as close to the center of your body as possible.  Follow these steps when putting down a heavy load:  Stand with your feet shoulder-width apart.  Lower the object slowly while you tighten the muscles in your legs, abdomen, and buttocks. Keep the object as close to the center of your body as possible.  Keep your shoulders pulled back, your chin tucked in, and your back straight.  Bend at your knees. Squat  down, but keep your heels off the floor.  Use handles or lifting straps if they are available. Twisting and Reaching  Avoid lifting heavy objects above your waist.  Do not twist at your waist while you are lifting or carrying a load. If you need to turn, move your feet.  Do not bend over without bending at your knees.  Avoid reaching over your head, across a table, or for an object on a high surface. WHAT ARE SOME OTHER TIPS?  Avoid wet floors and icy ground. Keep sidewalks clear of ice to prevent falls.  Do not sleep on a mattress that is too soft or too hard.  Keep items that are used frequently within easy reach.  Put heavier objects on shelves at waist level, and put lighter objects on lower or higher shelves.  Find ways to decrease your stress, such as exercise, massage, or relaxation techniques. Stress can build up in your muscles. Tense muscles are more vulnerable to injury.  Talk with your health care provider if you feel anxious or depressed. These conditions can make back pain worse.  Wear flat heel shoes with cushioned soles.  Avoid sudden movements.  Use both shoulder straps when carrying a backpack.  Do not use any tobacco products, including cigarettes, chewing tobacco, or electronic cigarettes. If you need help quitting, ask your health care provider.   This information is not intended to replace advice given to you by your health care provider. Make sure you discuss any questions you have with your health care provider.   Document Released: 08/11/2004 Document Revised: 11/18/2014 Document Reviewed: 07/08/2014 Elsevier Interactive Patient Education 2016 Elsevier Inc.  Abdominal Pain During Pregnancy Belly (abdominal) pain is common during pregnancy. Most of the time, it is not a serious problem. Other times, it can be a sign that something is wrong with the pregnancy. Always tell your doctor if you have belly pain. HOME CARE Monitor your belly pain for any  changes. The following actions may help you feel better:  Do not have sex (intercourse) or put anything in your vagina until you feel better.  Rest until your pain stops.  Drink clear fluids if you feel sick to your stomach (nauseous). Do not eat solid food until you feel better.  Only take medicine as told by your doctor.  Keep all doctor visits as told. GET HELP RIGHT AWAY IF:   You are bleeding, leaking fluid, or pieces of tissue come out of your vagina.  You have more pain or cramping.  You keep throwing up (vomiting).  You have pain when you pee (urinate) or have blood in your pee.  You have a fever.  You do not feel your baby moving as much.  You feel very weak or feel like passing out.  You have trouble breathing, with or without belly pain.  You have  a very bad headache and belly pain.  You have fluid leaking from your vagina and belly pain.  You keep having watery poop (diarrhea).  Your belly pain does not go away after resting, or the pain gets worse. MAKE SURE YOU:   Understand these instructions.  Will watch your condition.  Will get help right away if you are not doing well or get worse.   This information is not intended to replace advice given to you by your health care provider. Make sure you discuss any questions you have with your health care provider.   Document Released: 06/22/2009 Document Revised: 03/06/2013 Document Reviewed: 01/31/2013 Elsevier Interactive Patient Education [employer].

## 2015-07-01 NOTE — MAU Provider Note (Signed)
Chief Complaint:  Back Pain and Abdominal Pain   First Provider Initiated Contact with Patient 07/01/15 2141     HPI   [name] is a 34 y.o. [id number] at 367w4d who presents to maternity admissions reporting mid to upper back pain which wraps around to upper abdomen. States it started today at work. Comes and goes. Does not feel tightening with it. No history of preterm labor. Nothing makes it better or worse. Movement does not affect it. No history of back issues.  She reports good fetal movement, denies LOF, vaginal bleeding, vaginal itching/burning, urinary symptoms, h/a, dizziness, n/v, or fever/chills.  She denies headache, visual changes.   Past Medical History: Past Medical History  Diagnosis Date  . Hypertension     Past obstetric history: OB History  Gravida Para Term Preterm AB SAB TAB Ectopic Multiple Living  5 2 2  2   0   2    # Outcome Date GA Lbr Len/2nd Weight Sex Delivery Anes PTL Lv  5 Current           4 AB           3 Term           2 AB           1 Term               Past Surgical History: Past Surgical History  Procedure Laterality Date  . Cesarean section    . Therapeutic abortion      X4    Family History: Family History  Problem Relation Age of Onset  . Diabetes Mother   . Hypertension Mother   . Hypertension Sister     Social History: Social History  Substance Use Topics  . Smoking status: Never Smoker   . Smokeless tobacco: None  . Alcohol Use: No    Allergies: No Known Allergies  Meds:  Prescriptions prior to admission  Medication Sig Dispense Refill Last Dose  . acetaminophen (TYLENOL) 500 MG tablet Take 1,000 mg by mouth every 6 (six) hours as needed for mild pain or headache.   Taking  . Cholecalciferol (VITAMIN D) 2000 UNITS CAPS Take 2,000 Units by mouth daily.   Taking  . Fe Cbn-Fe Gluc-FA-B12-C-DSS (FERRALET 90 PO) Take 1 tablet by mouth daily.   Taking  . labetalol (NORMODYNE) 200 MG tablet Take 200 mg by mouth 2  (two) times daily.   Taking  . Omeprazole (PRILOSEC PO) Take 1 tablet by mouth daily.   Taking  . Prenatal Vit-Min-FA-Fish Oil (CVS PRENATAL GUMMY PO) Take 2 tablets by mouth daily.   Taking    ROS:  Review of Systems  Constitutional: Negative for fever, chills and activity change.  Respiratory: Negative for cough, chest tightness, shortness of breath and wheezing.   Cardiovascular: Negative for chest pain and leg swelling.  Gastrointestinal: Positive for abdominal pain. Negative for nausea, vomiting, diarrhea and constipation.  Genitourinary: Negative for flank pain, decreased urine volume, vaginal bleeding, vaginal discharge and pelvic pain.  Musculoskeletal: Positive for back pain. Negative for myalgias and neck pain.  Neurological: Negative for dizziness.   I have reviewed patient's Past Medical Hx, Surgical Hx, Family Hx, Social Hx, medications and allergies.   Physical Exam  Patient Vitals for the past 24 hrs:  BP Temp Pulse SpO2 Height Weight  07/01/15 2107 162/81 mmHg 98.3 F (36.8 C) 86 100 % 5' 8.5" (1.74 m) 113.671 kg (250 lb 9.6 oz)   Constitutional: Well-developed,  well-nourished female in no acute distress.  Cardiovascular: normal rate and rhythm Respiratory: normal effort, no respiratory distress GI: Abd soft, non-tender, gravid appropriate for gestational age.  Back:  No point tenderness to area of pain. Points to approximate area of T10-12 then wraps around the dermatome to upper abdomen.  Negative Straight leg raises.  No pain with movement. Able to sit up without pain.  MS: Extremities nontender, no edema, normal ROM Neurologic: Alert and oriented x 4.  GU: Neg CVAT.  Bimanual exam: Cervix 0/long/high, firm, anterior, neg CMT, uterus nontender, nonenlarged, adnexa without tenderness, enlargement, or mass    FHT:  Baseline variability, accelerations present, no decelerations Contractions:  Irregular lasting 10-20 seconds each   Labs: Results for  orders placed or performed during the hospital encounter of 07/01/15 (from the past 24 hour(s))  Urinalysis, Routine w reflex microscopic (not at [facility])     Status: None   Collection Time: 07/01/15  9:10 PM  Result Value Ref Range   Color, Urine YELLOW YELLOW   APPearance CLEAR CLEAR   Specific Gravity, Urine 1.015 1.005 - 1.030   pH 6.0 5.0 - 8.0   Glucose, UA NEGATIVE NEGATIVE mg/dL   Hgb urine dipstick NEGATIVE NEGATIVE   Bilirubin Urine NEGATIVE NEGATIVE   Ketones, ur NEGATIVE NEGATIVE mg/dL   Protein, ur NEGATIVE NEGATIVE mg/dL   Nitrite NEGATIVE NEGATIVE   Leukocytes, UA NEGATIVE NEGATIVE      Imaging:  Dg Chest 2 View  06/23/2015  CLINICAL DATA:  Elevated blood pressure. Shortness of breath. [redacted] weeks pregnant. EXAM: CHEST  2 VIEW COMPARISON:  None. FINDINGS: The heart size and mediastinal contours are within normal limits. Both lungs are clear. The visualized skeletal structures are unremarkable. IMPRESSION: No active cardiopulmonary disease. Electronically Signed   By: Signa Kell M.D.   On: 06/23/2015 17:44    MAU Course/MDM: I have ordered labs and reviewed results.  NST reviewed Consult Dr Vincente Poli with presentation, exam findings, and test results.  Treatments in MAU included none Pt stable at time of discharge.   Assessment: SIUP at [redacted]w[redacted]d  Mid back pain No evidence of preterm labor Reassuring reactive FHR tracing  Plan: Discharge home Labor precautions and fetal kick counts Follow up in Office for prenatal visits and recheck of back tomorrow as scheduled    Medication List    ASK your doctor about these medications        acetaminophen 500 MG tablet  Commonly known as:  TYLENOL  Take 1,000 mg by mouth every 6 (six) hours as needed for mild pain or headache.     CVS PRENATAL GUMMY PO  Take 2 tablets by mouth daily.     FERRALET 90 PO  Take 1 tablet by mouth daily.     labetalol 200 MG tablet  Commonly known as:  NORMODYNE  Take 200 mg by mouth 2  (two) times daily.     PRILOSEC PO  Take 1 tablet by mouth daily.     Vitamin D 2000 UNITS Caps  Take 2,000 Units by mouth daily.        Wynelle Bourgeois CNM, MSN Certified Nurse-Midwife 07/01/2015 9:54 PM

## 2015-07-01 NOTE — MAU Note (Signed)
Pt c/o lower back pain and lower abdominal pain that started today. Does not feel like contractions-rates 8/10. Took 2 extra tylenol around 4pm-has not helped. Has appointment tomorrow but states the on call nurse told her to come in. Denies bleeding or discharge. Denies urinary s/s.

## 2015-07-02 ENCOUNTER — Encounter: Payer: Self-pay | Admitting: Internal Medicine

## 2015-07-02 ENCOUNTER — Other Ambulatory Visit: Payer: Self-pay | Admitting: Internal Medicine

## 2015-07-16 ENCOUNTER — Ambulatory Visit: Payer: 59 | Admitting: Cardiovascular Disease

## 2015-07-16 ENCOUNTER — Ambulatory Visit (HOSPITAL_COMMUNITY): Payer: 59 | Attending: Cardiovascular Disease

## 2015-07-16 ENCOUNTER — Other Ambulatory Visit: Payer: Self-pay

## 2015-07-16 DIAGNOSIS — I517 Cardiomegaly: Secondary | ICD-10-CM | POA: Insufficient documentation

## 2015-07-16 DIAGNOSIS — R0602 Shortness of breath: Secondary | ICD-10-CM | POA: Insufficient documentation

## 2015-07-16 DIAGNOSIS — Z3A29 29 weeks gestation of pregnancy: Secondary | ICD-10-CM | POA: Insufficient documentation

## 2015-07-16 DIAGNOSIS — R0600 Dyspnea, unspecified: Secondary | ICD-10-CM | POA: Insufficient documentation

## 2015-07-21 ENCOUNTER — Ambulatory Visit: Payer: 59 | Admitting: Cardiovascular Disease

## 2015-07-30 ENCOUNTER — Inpatient Hospital Stay (HOSPITAL_COMMUNITY)
Admission: AD | Admit: 2015-07-30 | Discharge: 2015-07-30 | Disposition: A | Discharge status: Home / Self Care | Payer: 59 | Source: Ambulatory Visit | Attending: Obstetrics & Gynecology | Admitting: Obstetrics & Gynecology

## 2015-07-30 ENCOUNTER — Encounter (HOSPITAL_COMMUNITY): Payer: Self-pay

## 2015-07-30 DIAGNOSIS — R519 Headache, unspecified: Secondary | ICD-10-CM

## 2015-07-30 DIAGNOSIS — Z3A30 30 weeks gestation of pregnancy: Secondary | ICD-10-CM | POA: Insufficient documentation

## 2015-07-30 DIAGNOSIS — O9989 Other specified diseases and conditions complicating pregnancy, childbirth and the puerperium: Secondary | ICD-10-CM | POA: Diagnosis not present

## 2015-07-30 DIAGNOSIS — R51 Headache: Secondary | ICD-10-CM

## 2015-07-30 DIAGNOSIS — O163 Unspecified maternal hypertension, third trimester: Secondary | ICD-10-CM

## 2015-07-30 LAB — CBC
HEMATOCRIT: 29.2 % — AB (ref 36.0–46.0)
HEMOGLOBIN: 9.1 g/dL — AB (ref 12.0–15.0)
MCH: 21.8 pg — AB (ref 26.0–34.0)
MCHC: 31.2 g/dL (ref 30.0–36.0)
MCV: 70 fL — ABNORMAL LOW (ref 78.0–100.0)
Platelets: 221 10*3/uL (ref 150–400)
RBC: 4.17 MIL/uL (ref 3.87–5.11)
RDW: 13.9 % (ref 11.5–15.5)
WBC: 9.5 10*3/uL (ref 4.0–10.5)

## 2015-07-30 LAB — COMPREHENSIVE METABOLIC PANEL
ALT: 12 U/L — ABNORMAL LOW (ref 14–54)
AST: 18 U/L (ref 15–41)
Albumin: 3 g/dL — ABNORMAL LOW (ref 3.5–5.0)
Alkaline Phosphatase: 104 U/L (ref 38–126)
Anion gap: 10 (ref 5–15)
BUN: 9 mg/dL (ref 6–20)
CHLORIDE: 105 mmol/L (ref 101–111)
CO2: 20 mmol/L — ABNORMAL LOW (ref 22–32)
Calcium: 9.2 mg/dL (ref 8.9–10.3)
Creatinine, Ser: 0.65 mg/dL (ref 0.44–1.00)
Glucose, Bld: 155 mg/dL — ABNORMAL HIGH (ref 65–99)
POTASSIUM: 3.6 mmol/L (ref 3.5–5.1)
Sodium: 135 mmol/L (ref 135–145)
Total Bilirubin: 0.4 mg/dL (ref 0.3–1.2)
Total Protein: 6.7 g/dL (ref 6.5–8.1)

## 2015-07-30 LAB — URINALYSIS, ROUTINE W REFLEX MICROSCOPIC
Bilirubin Urine: NEGATIVE
GLUCOSE, UA: NEGATIVE mg/dL
Hgb urine dipstick: NEGATIVE
Ketones, ur: 15 mg/dL — AB
LEUKOCYTES UA: NEGATIVE
NITRITE: NEGATIVE
PH: 6 (ref 5.0–8.0)
PROTEIN: NEGATIVE mg/dL
Specific Gravity, Urine: >1.03 — ABNORMAL HIGH (ref 1.005–1.030)

## 2015-07-30 LAB — PROTEIN / CREATININE RATIO, URINE
Creatinine, Urine: 207 mg/dL
PROTEIN CREATININE RATIO: 0.13 mg/mg{creat} (ref 0.00–0.15)
TOTAL PROTEIN, URINE: 27 mg/dL

## 2015-07-30 LAB — LACTATE DEHYDROGENASE: LDH: 103 U/L (ref 98–192)

## 2015-07-30 LAB — URIC ACID: URIC ACID, SERUM: 5 mg/dL (ref 2.3–6.6)

## 2015-07-30 MED ORDER — DEXTROSE 5 % IN LACTATED RINGERS IV BOLUS
1000.0000 mL | Freq: Once | INTRAVENOUS | Status: AC
  Administered 2015-07-30: 1000 mL via INTRAVENOUS

## 2015-07-30 MED ORDER — DEXAMETHASONE SODIUM PHOSPHATE 10 MG/ML IJ SOLN
10.0000 mg | Freq: Once | INTRAMUSCULAR | Status: AC
  Administered 2015-07-30: 10 mg via INTRAVENOUS
  Filled 2015-07-30: qty 1

## 2015-07-30 MED ORDER — DIPHENHYDRAMINE HCL 50 MG/ML IJ SOLN
25.0000 mg | Freq: Once | INTRAMUSCULAR | Status: AC
  Administered 2015-07-30: 25 mg via INTRAVENOUS
  Filled 2015-07-30: qty 1

## 2015-07-30 MED ORDER — METOCLOPRAMIDE HCL 5 MG/ML IJ SOLN
10.0000 mg | Freq: Once | INTRAMUSCULAR | Status: AC
  Administered 2015-07-30: 10 mg via INTRAVENOUS
  Filled 2015-07-30: qty 2

## 2015-07-30 NOTE — Discharge Instructions (Signed)
General Headache Without Cause A headache is pain or discomfort felt around the head or neck area. There are many causes and types of headaches. In some cases, the cause may not be found.  HOME CARE  Managing Pain  Take over-the-counter and prescription medicines only as told by your doctor.  Lie down in a dark, quiet room when you have a headache.  If directed, apply ice to the head and neck area:  Put ice in a plastic bag.  Place a towel between your skin and the bag.  Leave the ice on for 20 minutes, 2-3 times per day.  Use a heating pad or hot shower to apply heat to the head and neck area as told by your doctor.  Keep lights dim if bright lights bother you or make your headaches worse. Eating and Drinking  Eat meals on a regular schedule.  Lessen how much alcohol you drink.  Lessen how much caffeine you drink, or stop drinking caffeine. General Instructions  Keep all follow-up visits as told by your doctor. This is important.  Keep a journal to find out if certain things bring on headaches. For example, write down:  What you eat and drink.  How much sleep you get.  Any change to your diet or medicines.  Relax by getting a massage or doing other relaxing activities.  Lessen stress.  Sit up straight. Do not tighten (tense) your muscles.  Do not use tobacco products. This includes cigarettes, chewing tobacco, or e-cigarettes. If you need help quitting, ask your doctor.  Exercise regularly as told by your doctor.  Get enough sleep. This often means 7-9 hours of sleep. GET HELP IF:  Your symptoms are not helped by medicine.  You have a headache that feels different than the other headaches.  You feel sick to your stomach (nauseous) or you throw up (vomit).  You have a fever. GET HELP RIGHT AWAY IF:   Your headache becomes really bad.  You keep throwing up.  You have a stiff neck.  You have trouble seeing.  You have trouble speaking.  You have  pain in the eye or ear.  Your muscles are weak or you lose muscle control.  You lose your balance or have trouble walking.  You feel like you will pass out (faint) or you pass out.  You have confusion.   This information is not intended to replace advice given to you by your health care provider. Make sure you discuss any questions you have with your health care provider.   Document Released: 04/12/2008 Document Revised: 03/25/2015 Document Reviewed: 10/27/2014 Elsevier Interactive Patient Education 2016 Elsevier Inc. Dehydration, Adult Dehydration means your body does not have as much fluid or water as it needs. It happens when you take in less fluid than you lose. Your kidneys, brain, and heart will not work properly without the right amount of fluids.  Dehydration can range from mild to severe. It should be treated right away to help prevent it from becoming severe. HOME CARE  Drink enough fluid to keep your pee (urine) clear or pale yellow.  Drink water or fluid slowly by taking small sips. You can also try sucking on ice cubes.  Have food or drinks that contain electrolytes. Examples include bananas and sports drinks.  Take over-the-counter and prescription medicines only as told by your doctor.  Prepare oral rehydration solution (ORS) according to the instructions that came with it. Take sips of ORS every 5 minutes until your  pee returns to normal.  If you are throwing up (vomiting) or have watery poop (diarrhea), keep trying to drink water, ORS, or both.  If you have watery poop, avoid:  Drinks with caffeine.  Fruit juice.  Milk.  Carbonated soft drinks.  Do not take salt tablets. This can lead to having too much sodium in your body (hypernatremia). GET HELP IF:  You cannot eat or drink without throwing up.  You have had mild watery poop for longer than 24 hours.  You have a fever. GET HELP RIGHT AWAY IF:   You have very strong thirst.  You have very bad  watery poop.  You have not peed in 6-8 hours, or you have peed only a small amount of very dark pee.  You have shriveled skin.  You are dizzy, confused, or both.   This information is not intended to replace advice given to you by your health care provider. Make sure you discuss any questions you have with your health care provider.   Document Released: 04/30/2009 Document Revised: 03/25/2015 Document Reviewed: 11/19/2014 Elsevier Interactive Patient Education [employer] Elsevier Inc[employer]

## 2015-07-30 NOTE — MAU Note (Signed)
Pt presents to MAU for dehydration and headache. Pt was evaluated by Dr Langston MaskerMorris today and was told to come in for IV fluids and medications for her headache

## 2015-07-30 NOTE — MAU Provider Note (Signed)
Chief Complaint:  Headache and Dehydration   First Provider Initiated Contact with Patient 07/30/15 1455      HPI: [name] is a 35 y.o. [id number] at 6023w5d who presents to maternity admissions sent from the office for headache management and IV fluids.  She reports h/a x 1 week that is right sided only and comes and goes but occurs daily. Nothing makes the headache better or worse.  Today, she reports that she stood up and had an additional sharp pain on the right side of her head and felt like she had to sit down. Those additional symptoms resolved with sitting but right sided h/a continues.  She reports good fetal movement, denies regular contractions, LOF, vaginal bleeding, vaginal itching/burning, urinary symptoms, dizziness, n/v, or fever/chills.    HPI  Past Medical History: Past Medical History  Diagnosis Date  . Hypertension     Past obstetric history: OB History  Gravida Para Term Preterm AB SAB TAB Ectopic Multiple Living  5 2 2  2  2   2     # Outcome Date GA Lbr Len/2nd Weight Sex Delivery Anes PTL Lv  5 Current           4 TAB           3 Term           2 TAB           1 Term               Past Surgical History: Past Surgical History  Procedure Laterality Date  . Cesarean section    . Therapeutic abortion      X4    Family History: Family History  Problem Relation Age of Onset  . Diabetes Mother   . Hypertension Mother   . Hypertension Sister     Social History: Social History  Substance Use Topics  . Smoking status: Never Smoker   . Smokeless tobacco: None  . Alcohol Use: No    Allergies: No Known Allergies  Meds:  Prescriptions prior to admission  Medication Sig Dispense Refill Last Dose  . acetaminophen (TYLENOL) 500 MG tablet Take 1,000 mg by mouth every 6 (six) hours as needed for mild pain or headache.   07/29/2015 at Unknown time  . Cholecalciferol (VITAMIN D) 2000 UNITS CAPS Take 2,000 Units by mouth daily.   07/30/2015 at Unknown  time  . Fe Cbn-Fe Gluc-FA-B12-C-DSS (FERRALET 90 PO) Take 1 tablet by mouth daily.   07/30/2015 at Unknown time  . labetalol (NORMODYNE) 200 MG tablet Take 200 mg by mouth 2 (two) times daily.   07/30/2015 at 1100  . omeprazole (PRILOSEC) 20 MG capsule Take 20 mg by mouth daily.   07/30/2015 at Unknown time  . Prenatal Vit-Fe Fumarate-FA (PRENATAL MULTIVITAMIN) TABS tablet Take 1 tablet by mouth daily at 12 noon.   Past Month at Unknown time    ROS:  Review of Systems  Constitutional: Negative for fever, chills and fatigue.  Eyes: Negative for visual disturbance.  Respiratory: Negative for shortness of breath.   Cardiovascular: Negative for chest pain.  Gastrointestinal: Negative for abdominal pain.  Genitourinary: Negative for dysuria, flank pain, vaginal bleeding, vaginal discharge, difficulty urinating, vaginal pain and pelvic pain.  Neurological: Positive for headaches. Negative for dizziness.  Psychiatric/Behavioral: Negative.      I have reviewed patient's Past Medical Hx, Surgical Hx, Family Hx, Social Hx, medications and allergies.   Physical Exam   Patient Vitals for the  past 24 hrs:  BP Temp Temp src Pulse Resp  07/30/15 1704 - - Oral - 16  07/30/15 1517 157/70 mmHg - - 70 -  07/30/15 1502 138/68 mmHg - - 86 -  07/30/15 1459 150/70 mmHg - - 90 -  07/30/15 1417 159/83 mmHg 98 F (36.7 C) - 98 18   Constitutional: Well-developed, well-nourished female in no acute distress.  Cardiovascular: normal rate Respiratory: normal effort GI: Abd soft, non-tender, gravid appropriate for gestational age.  MS: Extremities nontender, no edema, normal ROM Neurologic: Alert and oriented x 4.  GU: Neg CVAT.     FHT:  Baseline 135 , moderate variability, accelerations present, no decelerations Contractions: None on toco or to palpation   Labs: Results for orders placed or performed during the hospital encounter of 07/30/15 (from the past 24 hour(s))  Urinalysis, Routine w reflex  microscopic (not at [facility])     Status: Abnormal   Collection Time: 07/30/15  2:15 PM  Result Value Ref Range   Color, Urine YELLOW YELLOW   APPearance CLOUDY (A) CLEAR   Specific Gravity, Urine >1.030 (H) 1.005 - 1.030   pH 6.0 5.0 - 8.0   Glucose, UA NEGATIVE NEGATIVE mg/dL   Hgb urine dipstick NEGATIVE NEGATIVE   Bilirubin Urine NEGATIVE NEGATIVE   Ketones, ur 15 (A) NEGATIVE mg/dL   Protein, ur NEGATIVE NEGATIVE mg/dL   Nitrite NEGATIVE NEGATIVE   Leukocytes, UA NEGATIVE NEGATIVE  Protein / creatinine ratio, urine     Status: None   Collection Time: 07/30/15  2:15 PM  Result Value Ref Range   Creatinine, Urine 207.00 mg/dL   Total Protein, Urine 27 mg/dL   Protein Creatinine Ratio 0.13 0.00 - 0.15 mg/mg[Cre]  CBC     Status: Abnormal   Collection Time: 07/30/15  3:15 PM  Result Value Ref Range   WBC 9.5 4.0 - 10.5 K/uL   RBC 4.17 3.87 - 5.11 MIL/uL   Hemoglobin 9.1 (L) 12.0 - 15.0 g/dL   HCT 16.1 (L) 09.6 - 04.5 %   MCV 70.0 (L) 78.0 - 100.0 fL   MCH 21.8 (L) 26.0 - 34.0 pg   MCHC 31.2 30.0 - 36.0 g/dL   RDW 40.9 81.1 - 91.4 %   Platelets 221 150 - 400 K/uL  Comprehensive metabolic panel     Status: Abnormal   Collection Time: 07/30/15  3:15 PM  Result Value Ref Range   Sodium 135 135 - 145 mmol/L   Potassium 3.6 3.5 - 5.1 mmol/L   Chloride 105 101 - 111 mmol/L   CO2 20 (L) 22 - 32 mmol/L   Glucose, Bld 155 (H) 65 - 99 mg/dL   BUN 9 6 - 20 mg/dL   Creatinine, Ser 7.82 0.44 - 1.00 mg/dL   Calcium 9.2 8.9 - 95.6 mg/dL   Total Protein 6.7 6.5 - 8.1 g/dL   Albumin 3.0 (L) 3.5 - 5.0 g/dL   AST 18 15 - 41 U/L   ALT 12 (L) 14 - 54 U/L   Alkaline Phosphatase 104 38 - 126 U/L   Total Bilirubin 0.4 0.3 - 1.2 mg/dL   GFR calc non Af Amer >60 >60 mL/min   GFR calc Af Amer >60 >60 mL/min   Anion gap 10 5 - 15  Uric acid     Status: None   Collection Time: 07/30/15  3:15 PM  Result Value Ref Range   Uric Acid, Serum 5.0 2.3 - 6.6 mg/dL  Lactate dehydrogenase  Status:  None   Collection Time: 07/30/15  3:15 PM  Result Value Ref Range   LDH 103 98 - 192 U/L      Imaging:  No results found.  MAU Course/MDM: I have ordered labs and reviewed results.  Reviewed FHR tracing.  Consult Dr. Langston Masker. She agrees with plan for discharge at this time given patient no longer has headache and labs were negative for evidence of pre-eclampsia.  Treatments in MAU included IV D5LR, 25 mg Benadryl, 10 mg Reglan and 10 mg Decadron, CBC, CMP, Uric acid, LDH and Urine Protein/Creatinine Ratio. Reassessed patient's pain prior to discharge. Patient denies pain at this time. Has been resting comfortably while in MAU.  Pt stable at time of discharge.  Assessment: 1. Acute nonintractable headache, unspecified headache type   2. Hypertension in pregnancy, antepartum, third trimester     Plan: Discharge home Warning signs for worsening condition discussed Patient advised to follow-up with [facility] as scheduled or sooner if symptoms persist or worsen Patient may return to MAU as needed or if her condition were to change or worsen     Follow-up Information    Follow up with [facility].   Why:  As scheduled or sooner if symptoms persist   Contact information:   [address] [place] [address] [phone]        Medication List    TAKE these medications        acetaminophen 500 MG tablet  Commonly known as:  TYLENOL  Take 1,000 mg by mouth every 6 (six) hours as needed for mild pain or headache.     FERRALET 90 PO  Take 1 tablet by mouth daily.     labetalol 200 MG tablet  Commonly known as:  NORMODYNE  Take 200 mg by mouth 2 (two) times daily.     omeprazole 20 MG capsule  Commonly known as:  PRILOSEC  Take 20 mg by mouth daily.     prenatal multivitamin Tabs tablet  Take 1 tablet by mouth daily at 12 noon.     Vitamin D 2000 units Caps  Take 2,000 Units by mouth daily.        Sharen Counter Certified Nurse-Midwife 07/30/2015 5:05 PM   Care assumed from Sharen Counter, CNM Discussed results with Dr. Langston Masker. Advised that as long as headache is significantly improved that patient may be discharged.  Patient reports no pain at time of discharge.  Discharge instructions discussed. Patient to follow-up as indicated above.   Marny Lowenstein, PA-C 07/30/2015 5:12 PM

## 2015-07-30 NOTE — MAU Note (Signed)
Patient strip was charted in obix from [id number] in another patient's chart (mrn [id number]).

## 2015-09-01 ENCOUNTER — Encounter (HOSPITAL_COMMUNITY): Payer: Self-pay | Admitting: *Deleted

## 2015-09-01 ENCOUNTER — Inpatient Hospital Stay (HOSPITAL_COMMUNITY)
Admission: AD | Admit: 2015-09-01 | Discharge: 2015-09-02 | Disposition: A | Discharge status: Home / Self Care | Payer: 59 | Source: Ambulatory Visit | Attending: Obstetrics and Gynecology | Admitting: Obstetrics and Gynecology

## 2015-09-01 DIAGNOSIS — Z79899 Other long term (current) drug therapy: Secondary | ICD-10-CM | POA: Diagnosis not present

## 2015-09-01 DIAGNOSIS — O10919 Unspecified pre-existing hypertension complicating pregnancy, unspecified trimester: Secondary | ICD-10-CM | POA: Insufficient documentation

## 2015-09-01 DIAGNOSIS — Z8249 Family history of ischemic heart disease and other diseases of the circulatory system: Secondary | ICD-10-CM | POA: Diagnosis not present

## 2015-09-01 DIAGNOSIS — R600 Localized edema: Secondary | ICD-10-CM | POA: Diagnosis not present

## 2015-09-01 DIAGNOSIS — Z9889 Other specified postprocedural states: Secondary | ICD-10-CM | POA: Insufficient documentation

## 2015-09-01 DIAGNOSIS — O10913 Unspecified pre-existing hypertension complicating pregnancy, third trimester: Secondary | ICD-10-CM | POA: Diagnosis not present

## 2015-09-01 DIAGNOSIS — Z3A35 35 weeks gestation of pregnancy: Secondary | ICD-10-CM | POA: Diagnosis not present

## 2015-09-01 DIAGNOSIS — R1011 Right upper quadrant pain: Secondary | ICD-10-CM | POA: Insufficient documentation

## 2015-09-01 LAB — COMPREHENSIVE METABOLIC PANEL
ALT: 10 U/L — AB (ref 14–54)
AST: 21 U/L (ref 15–41)
Albumin: 2.9 g/dL — ABNORMAL LOW (ref 3.5–5.0)
Alkaline Phosphatase: 168 U/L — ABNORMAL HIGH (ref 38–126)
Anion gap: 9 (ref 5–15)
BUN: 9 mg/dL (ref 6–20)
CHLORIDE: 107 mmol/L (ref 101–111)
CO2: 20 mmol/L — AB (ref 22–32)
CREATININE: 0.59 mg/dL (ref 0.44–1.00)
Calcium: 9.1 mg/dL (ref 8.9–10.3)
GFR calc Af Amer: >60 mL/min (ref >60)
GFR calc non Af Amer: >60 mL/min (ref >60)
Glucose, Bld: 109 mg/dL — ABNORMAL HIGH (ref 65–99)
POTASSIUM: 4.1 mmol/L (ref 3.5–5.1)
SODIUM: 136 mmol/L (ref 135–145)
Total Bilirubin: 0.2 mg/dL — ABNORMAL LOW (ref 0.3–1.2)
Total Protein: 7.1 g/dL (ref 6.5–8.1)

## 2015-09-01 LAB — CBC WITH DIFFERENTIAL/PLATELET
BASOS ABS: 0 10*3/uL (ref 0.0–0.1)
BASOS PCT: 0 %
EOS ABS: 0.1 10*3/uL (ref 0.0–0.7)
EOS PCT: 1 %
HCT: 30.9 % — ABNORMAL LOW (ref 36.0–46.0)
Hemoglobin: 9.8 g/dL — ABNORMAL LOW (ref 12.0–15.0)
Lymphocytes Relative: 38 %
Lymphs Abs: 3.6 10*3/uL (ref 0.7–4.0)
MCH: 21.4 pg — ABNORMAL LOW (ref 26.0–34.0)
MCHC: 31.7 g/dL (ref 30.0–36.0)
MCV: 67.3 fL — ABNORMAL LOW (ref 78.0–100.0)
Monocytes Absolute: 0.6 10*3/uL (ref 0.1–1.0)
Monocytes Relative: 6 %
Neutro Abs: 5.3 10*3/uL (ref 1.7–7.7)
Neutrophils Relative %: 55 %
PLATELETS: 212 10*3/uL (ref 150–400)
RBC: 4.59 MIL/uL (ref 3.87–5.11)
RDW: 14.4 % (ref 11.5–15.5)
WBC: 9.6 10*3/uL (ref 4.0–10.5)

## 2015-09-01 LAB — PROTEIN / CREATININE RATIO, URINE
Creatinine, Urine: 100 mg/dL
Protein Creatinine Ratio: 0.14 mg/mg{Cre} (ref 0.00–0.15)
TOTAL PROTEIN, URINE: 14 mg/dL

## 2015-09-01 LAB — URINALYSIS, ROUTINE W REFLEX MICROSCOPIC
Bilirubin Urine: NEGATIVE
Glucose, UA: NEGATIVE mg/dL
HGB URINE DIPSTICK: NEGATIVE
KETONES UR: NEGATIVE mg/dL
LEUKOCYTES UA: NEGATIVE
Nitrite: NEGATIVE
PROTEIN: NEGATIVE mg/dL
Specific Gravity, Urine: 1.02 (ref 1.005–1.030)
pH: 7 (ref 5.0–8.0)

## 2015-09-01 LAB — URIC ACID: URIC ACID, SERUM: 5.5 mg/dL (ref 2.3–6.6)

## 2015-09-01 LAB — LACTATE DEHYDROGENASE: LDH: 147 U/L (ref 98–192)

## 2015-09-01 NOTE — MAU Note (Signed)
Pt presents to MAU because of elevated BP at home. Pt currently taking labetalol 200 mg BID

## 2015-09-01 NOTE — Discharge Instructions (Signed)
Hypertension During Pregnancy °Hypertension is also called high blood pressure. Blood pressure moves blood in your body. Sometimes, the force that moves the blood becomes too strong. When you are pregnant, this condition should be watched carefully. It can cause problems for you and your baby. °HOME CARE  °· Make and keep all of your doctor visits. °· Take medicine as told by your doctor. Tell your doctor about all medicines you take. °· Eat very little salt. °· Exercise regularly. °· Do not drink alcohol. °· Do not smoke. °· Do not have drinks with caffeine. °· Lie on your left side when resting. °· Your health care provider may ask you to take one low-dose aspirin (81mg) each day. °GET HELP RIGHT AWAY IF: °· You have bad belly (abdominal) pain. °· You have sudden puffiness (swelling) in the hands, ankles, or face. °· You gain 4 pounds (1.8 kilograms) or more in 1 week. °· You throw up (vomit) repeatedly. °· You have bleeding from the vagina. °· You do not feel the baby moving as much. °· You have a headache. °· You have blurred or double vision. °· You have muscle twitching or spasms. °· You have shortness of breath. °· You have blue fingernails and lips. °· You have blood in your pee (urine). °MAKE SURE YOU: °· Understand these instructions. °· Will watch your condition. °· Will get help right away if you are not doing well or get worse. °  °This information is not intended to replace advice given to you by your health care provider. Make sure you discuss any questions you have with your health care provider. °  °Document Released: 08/06/2010 Document Revised: 07/25/2014 Document Reviewed: 01/31/2013 °Elsevier Interactive Patient Education ©2016 [employer] ° °Preeclampsia and Eclampsia °Preeclampsia is a serious condition that develops only during pregnancy. It is also called toxemia of pregnancy. This condition causes high blood pressure along with other symptoms, such as swelling and headaches. These may  develop as the condition gets worse. Preeclampsia may occur 20 weeks or later into your pregnancy.  °Diagnosing and treating preeclampsia early is very important. If not treated early, it can cause serious problems for you and your baby. One problem it can lead to is eclampsia, which is a condition that causes muscle jerking or shaking (convulsions) in the mother. Delivering your baby is the best treatment for preeclampsia or eclampsia.  °RISK FACTORS °The cause of preeclampsia is not known. You may be more likely to develop preeclampsia if you have certain risk factors. These include:  °· Being pregnant for the first time. °· Having preeclampsia in a past pregnancy. °· Having a family history of preeclampsia. °· Having high blood pressure. °· Being pregnant with twins or triplets. °· Being 35 or older. °· Being African American. °· Having kidney disease or diabetes. °· Having medical conditions such as lupus or blood diseases. °· Being very overweight (obese). °SIGNS AND SYMPTOMS  °The earliest signs of preeclampsia are: °· High blood pressure. °· Increased protein in your urine. Your health care provider will check for this at every prenatal visit. °Other symptoms that can develop include:  °· Severe headaches. °· Sudden weight gain. °· Swelling of your hands, face, legs, and feet. °· Feeling sick to your stomach (nauseous) and throwing up (vomiting). °· Vision problems (blurred or double vision). °· Numbness in your face, arms, legs, and feet. °· Dizziness. °· Slurred speech. °· Sensitivity to bright lights. °· Abdominal pain. °DIAGNOSIS  °There are no screening tests   for preeclampsia. Your health care provider will ask you about symptoms and check for signs of preeclampsia during your prenatal visits. You may also have tests, including: °· Urine testing. °· Blood testing. °· Checking your baby's heart rate. °· Checking the health of your baby and your placenta using images created with sound waves  (ultrasound). °TREATMENT  °You can work out the best treatment approach together with your health care provider. It is very important to keep all prenatal appointments. If you have an increased risk of preeclampsia, you may need more frequent prenatal exams. °· Your health care provider may prescribe bed rest. °· You may have to eat as little salt as possible. °· You may need to take medicine to lower your blood pressure if the condition does not respond to more conservative measures. °· You may need to stay in the hospital if your condition is severe. There, treatment will focus on controlling your blood pressure and fluid retention. You may also need to take medicine to prevent seizures. °· If the condition gets worse, your baby may need to be delivered early to protect you and the baby. You may have your labor started with medicine (be induced), or you may have a cesarean delivery. °· Preeclampsia usually goes away after the baby is born. °HOME CARE INSTRUCTIONS  °· Only take over-the-counter or prescription medicines as directed by your health care provider. °· Lie on your left side while resting. This keeps pressure off your baby. °· Elevate your feet while resting. °· Get regular exercise. Ask your health care provider what type of exercise is safe for you. °· Avoid caffeine and alcohol. °· Do not smoke. °· Drink 6-8 glasses of water every day. °· Eat a balanced diet that is low in salt. Do not add salt to your food. °· Avoid stressful situations as much as possible. °· Get plenty of rest and sleep. °· Keep all prenatal appointments and tests as scheduled. °SEEK MEDICAL CARE IF: °· You are gaining more weight than expected. °· You have any headaches, abdominal pain, or nausea. °· You are bruising more than usual. °· You feel dizzy or light-headed. °SEEK IMMEDIATE MEDICAL CARE IF:  °· You develop sudden or severe swelling anywhere in your body. This usually happens in the legs. °· You gain 5 lb (2.3 kg) or more  in a week. °· You have a severe headache, dizziness, problems with your vision, or confusion. °· You have severe abdominal pain. °· You have lasting nausea or vomiting. °· You have a seizure. °· You have trouble moving any part of your body. °· You develop numbness in your body. °· You have trouble speaking. °· You have any abnormal bleeding. °· You develop a stiff neck. °· You pass out. °MAKE SURE YOU:  °· Understand these instructions. °· Will watch your condition. °· Will get help right away if you are not doing well or get worse. °  °This information is not intended to replace advice given to you by your health care provider. Make sure you discuss any questions you have with your health care provider. °  °Document Released: 07/01/2000 Document Revised: 07/09/2013 Document Reviewed: 04/26/2013 °Elsevier Interactive Patient Education ©2016 [employer] Inc. ° °

## 2015-09-01 NOTE — MAU Provider Note (Signed)
History     CSN: [id number]  Arrival date and time: 09/01/15 2202   First Provider Initiated Contact with Patient 09/01/15 2240      Chief Complaint  Patient presents with  . Hypertension  . Edema   HPI [name] is a 35 y.o. [id number] at [redacted]w[redacted]d who presents to MAU today with complaint of elevated blood pressure and LE edema. The patient states a history of CHTN. She is currently on Labetalol. She states that BP at home tonight was 164/100 and 165/93. She denies headache or blurred vision. She states occasional floaters and mild intermittent RUQ abdominal pain as well as slightly increased bilateral lower extremity edema. She states occasional "braxton hicks" contractions. She denies vaginal bleeding, LOF or complications with this or previous pregnancies. She has had 2 prior C/S and has repeat scheduled on 09/17/15. She reports good fetal movement.   OB History    Gravida Para Term Preterm AB TAB SAB Ectopic Multiple Living   5 2 2  2 2    2       Past Medical History  Diagnosis Date  . Hypertension     Past Surgical History  Procedure Laterality Date  . Cesarean section    . Therapeutic abortion      X4    Family History  Problem Relation Age of Onset  . Diabetes Mother   . Hypertension Mother   . Hypertension Sister     Social History  Substance Use Topics  . Smoking status: Never Smoker   . Smokeless tobacco: None  . Alcohol Use: No    Allergies: No Known Allergies  Prescriptions prior to admission  Medication Sig Dispense Refill Last Dose  . acetaminophen (TYLENOL) 500 MG tablet Take 1,000 mg by mouth every 6 (six) hours as needed for mild pain or headache.   07/29/2015 at Unknown time  . Cholecalciferol (VITAMIN D) 2000 UNITS CAPS Take 2,000 Units by mouth daily.   07/30/2015 at Unknown time  . Fe Cbn-Fe Gluc-FA-B12-C-DSS (FERRALET 90 PO) Take 1 tablet by mouth daily.   07/30/2015 at Unknown time  . labetalol (NORMODYNE) 200 MG tablet Take 200 mg by  mouth 2 (two) times daily.   07/30/2015 at 1100  . omeprazole (PRILOSEC) 20 MG capsule Take 20 mg by mouth daily.   07/30/2015 at Unknown time  . Prenatal Vit-Fe Fumarate-FA (PRENATAL MULTIVITAMIN) TABS tablet Take 1 tablet by mouth daily at 12 noon.   Past Month at Unknown time    Review of Systems  Constitutional: Negative for fever and malaise/fatigue.  Eyes: Negative for blurred vision.       + floaters  Cardiovascular: Positive for leg swelling.  Gastrointestinal: Positive for abdominal pain.  Genitourinary:       Neg - vaginal bleeding, discharge, LOF  Neurological: Negative for headaches.   Physical Exam   Blood pressure 141/85, pulse 94, temperature 98 F (36.7 C), temperature source Oral, resp. rate 16, height 5\' 9"  (1.753 m), weight 268 lb 9.6 oz (121.836 kg), last menstrual period 12/27/2014.  Physical Exam  Nursing note and vitals reviewed. Constitutional: She is oriented to person, place, and time. She appears well-developed and well-nourished. No distress.  HENT:  Head: Normocephalic and atraumatic.  Cardiovascular: Normal rate and intact distal pulses.   Respiratory: Effort normal.  GI: Soft. She exhibits no distension and no mass. There is no tenderness. There is no rebound and no guarding.  Musculoskeletal: She exhibits edema (mild non-pitting edema of the bilateral  lower extremities).  Neurological: She is alert and oriented to person, place, and time. She has normal reflexes.  No clonus  Skin: Skin is warm and dry. No erythema.  Psychiatric: She has a normal mood and affect.   Results for orders placed or performed during the hospital encounter of 09/01/15 (from the past 24 hour(s))  Protein / creatinine ratio, urine     Status: None   Collection Time: 09/01/15 10:30 PM  Result Value Ref Range   Creatinine, Urine 100.00 mg/dL   Total Protein, Urine 14 mg/dL   Protein Creatinine Ratio 0.14 0.00 - 0.15 mg/mg[Cre]  Urinalysis, Routine w reflex microscopic (not  at [facility])     Status: None   Collection Time: 09/01/15 10:30 PM  Result Value Ref Range   Color, Urine YELLOW YELLOW   APPearance CLEAR CLEAR   Specific Gravity, Urine 1.020 1.005 - 1.030   pH 7.0 5.0 - 8.0   Glucose, UA NEGATIVE NEGATIVE mg/dL   Hgb urine dipstick NEGATIVE NEGATIVE   Bilirubin Urine NEGATIVE NEGATIVE   Ketones, ur NEGATIVE NEGATIVE mg/dL   Protein, ur NEGATIVE NEGATIVE mg/dL   Nitrite NEGATIVE NEGATIVE   Leukocytes, UA NEGATIVE NEGATIVE  CBC with Differential/Platelet     Status: Abnormal   Collection Time: 09/01/15 10:36 PM  Result Value Ref Range   WBC 9.6 4.0 - 10.5 K/uL   RBC 4.59 3.87 - 5.11 MIL/uL   Hemoglobin 9.8 (L) 12.0 - 15.0 g/dL   HCT 16.1 (L) 09.6 - 04.5 %   MCV 67.3 (L) 78.0 - 100.0 fL   MCH 21.4 (L) 26.0 - 34.0 pg   MCHC 31.7 30.0 - 36.0 g/dL   RDW 40.9 81.1 - 91.4 %   Platelets 212 150 - 400 K/uL   Neutrophils Relative % 55 %   Neutro Abs 5.3 1.7 - 7.7 K/uL   Lymphocytes Relative 38 %   Lymphs Abs 3.6 0.7 - 4.0 K/uL   Monocytes Relative 6 %   Monocytes Absolute 0.6 0.1 - 1.0 K/uL   Eosinophils Relative 1 %   Eosinophils Absolute 0.1 0.0 - 0.7 K/uL   Basophils Relative 0 %   Basophils Absolute 0.0 0.0 - 0.1 K/uL  Comprehensive metabolic panel     Status: Abnormal   Collection Time: 09/01/15 10:36 PM  Result Value Ref Range   Sodium 136 135 - 145 mmol/L   Potassium 4.1 3.5 - 5.1 mmol/L   Chloride 107 101 - 111 mmol/L   CO2 20 (L) 22 - 32 mmol/L   Glucose, Bld 109 (H) 65 - 99 mg/dL   BUN 9 6 - 20 mg/dL   Creatinine, Ser 7.82 0.44 - 1.00 mg/dL   Calcium 9.1 8.9 - 95.6 mg/dL   Total Protein 7.1 6.5 - 8.1 g/dL   Albumin 2.9 (L) 3.5 - 5.0 g/dL   AST 21 15 - 41 U/L   ALT 10 (L) 14 - 54 U/L   Alkaline Phosphatase 168 (H) 38 - 126 U/L   Total Bilirubin 0.2 (L) 0.3 - 1.2 mg/dL   GFR calc non Af Amer >60 >60 mL/min   GFR calc Af Amer >60 >60 mL/min   Anion gap 9 5 - 15  Uric acid     Status: None   Collection Time: 09/01/15 10:36 PM   Result Value Ref Range   Uric Acid, Serum 5.5 2.3 - 6.6 mg/dL  Lactate dehydrogenase     Status: None   Collection Time: 09/01/15 10:36 PM  Result Value Ref Range   LDH 147 98 - 192 U/L    Fetal Monitoring: Baseline: 135 bpm Variability: moderate Accelerations: 15 x 15 Decelerations: none Contractions: none  Patient Vitals for the past 24 hrs:  BP Temp Temp src Pulse Resp Height Weight  09/01/15 2324 141/85 mmHg - - 94 - - -  09/01/15 2314 131/82 mmHg - - 100 - - -  09/01/15 2304 130/67 mmHg - - 96 - - -  09/01/15 2256 131/75 mmHg - - 97 - - -  09/01/15 2254 135/68 mmHg - - 99 - - -  09/01/15 2248 148/92 mmHg 98 F (36.7 C) Oral 109 16 - -  09/01/15 2231 - - - - -  (1.753 m) 268 lb 9.6 oz (121.836 kg)    MAU Course  Procedures None  MDM Serial BPs UA, CBC, CMP, Uric acid, LDH and urine protein/creatinine ratio Discussed patient with Dr. Thana Ates. Agrees with plan for discharge at this time with precautions. Continued Labetalol and home BP monitoring. Follow-up as scheduled.   Assessment and Plan  A: SIUP at [redacted]w[redacted]d Chronic HTN in pregnancy, third trimester  P: Discharge home Continue labetalol as previously prescribed Pre-eclampsia and HTN precautions discussed Patient advised to follow-up with [facility] as scheduled or sooner PRN Patient may return to MAU as needed or if her condition were to change or worsen   Marny Lowenstein, PA-C  09/01/2015, 11:56 PM

## 2015-09-08 ENCOUNTER — Other Ambulatory Visit (HOSPITAL_COMMUNITY)
Admission: AD | Admit: 2015-09-08 | Discharge: 2015-09-08 | Disposition: A | Discharge status: Home / Self Care | Payer: 59 | Source: Ambulatory Visit | Attending: Obstetrics and Gynecology | Admitting: Obstetrics and Gynecology

## 2015-09-08 DIAGNOSIS — O133 Gestational [pregnancy-induced] hypertension without significant proteinuria, third trimester: Secondary | ICD-10-CM | POA: Diagnosis not present

## 2015-09-08 LAB — COMPREHENSIVE METABOLIC PANEL
ALBUMIN: 2.8 g/dL — AB (ref 3.5–5.0)
ALT: 9 U/L — AB (ref 14–54)
AST: 20 U/L (ref 15–41)
Alkaline Phosphatase: 167 U/L — ABNORMAL HIGH (ref 38–126)
Anion gap: 7 (ref 5–15)
BUN: 10 mg/dL (ref 6–20)
CHLORIDE: 108 mmol/L (ref 101–111)
CO2: 21 mmol/L — AB (ref 22–32)
CREATININE: 0.62 mg/dL (ref 0.44–1.00)
Calcium: 9.2 mg/dL (ref 8.9–10.3)
GFR calc non Af Amer: >60 mL/min (ref >60)
GLUCOSE: 97 mg/dL (ref 65–99)
Potassium: 3.8 mmol/L (ref 3.5–5.1)
SODIUM: 136 mmol/L (ref 135–145)
Total Bilirubin: 0.6 mg/dL (ref 0.3–1.2)
Total Protein: 6.7 g/dL (ref 6.5–8.1)

## 2015-09-08 LAB — CBC
HCT: 30.5 % — ABNORMAL LOW (ref 36.0–46.0)
HEMOGLOBIN: 9.6 g/dL — AB (ref 12.0–15.0)
MCH: 20.9 pg — AB (ref 26.0–34.0)
MCHC: 31.5 g/dL (ref 30.0–36.0)
MCV: 66.4 fL — ABNORMAL LOW (ref 78.0–100.0)
PLATELETS: 187 10*3/uL (ref 150–400)
RBC: 4.59 MIL/uL (ref 3.87–5.11)
RDW: 14.7 % (ref 11.5–15.5)
WBC: 7.4 10*3/uL (ref 4.0–10.5)

## 2015-09-08 LAB — URIC ACID: Uric Acid, Serum: 5.9 mg/dL (ref 2.3–6.6)

## 2015-09-10 ENCOUNTER — Encounter (HOSPITAL_COMMUNITY): Payer: Self-pay

## 2015-09-10 ENCOUNTER — Inpatient Hospital Stay (HOSPITAL_COMMUNITY)
Admission: AD | Admit: 2015-09-10 | Discharge: 2015-09-11 | Disposition: A | Discharge status: Home / Self Care | Payer: 59 | Source: Ambulatory Visit | Attending: Obstetrics & Gynecology | Admitting: Obstetrics & Gynecology

## 2015-09-10 DIAGNOSIS — Z3A36 36 weeks gestation of pregnancy: Secondary | ICD-10-CM | POA: Diagnosis not present

## 2015-09-10 DIAGNOSIS — O10912 Unspecified pre-existing hypertension complicating pregnancy, second trimester: Secondary | ICD-10-CM | POA: Diagnosis not present

## 2015-09-10 DIAGNOSIS — O10013 Pre-existing essential hypertension complicating pregnancy, third trimester: Secondary | ICD-10-CM | POA: Insufficient documentation

## 2015-09-10 LAB — COMPREHENSIVE METABOLIC PANEL
ALT: 10 U/L — ABNORMAL LOW (ref 14–54)
ANION GAP: 6 (ref 5–15)
AST: 21 U/L (ref 15–41)
Albumin: 2.8 g/dL — ABNORMAL LOW (ref 3.5–5.0)
Alkaline Phosphatase: 172 U/L — ABNORMAL HIGH (ref 38–126)
BILIRUBIN TOTAL: 0.5 mg/dL (ref 0.3–1.2)
BUN: 10 mg/dL (ref 6–20)
CALCIUM: 9.2 mg/dL (ref 8.9–10.3)
CO2: 21 mmol/L — ABNORMAL LOW (ref 22–32)
Chloride: 108 mmol/L (ref 101–111)
Creatinine, Ser: 0.54 mg/dL (ref 0.44–1.00)
GFR calc Af Amer: >60 mL/min (ref >60)
Glucose, Bld: 77 mg/dL (ref 65–99)
POTASSIUM: 3.9 mmol/L (ref 3.5–5.1)
Sodium: 135 mmol/L (ref 135–145)
TOTAL PROTEIN: 6.3 g/dL — AB (ref 6.5–8.1)

## 2015-09-10 LAB — URINALYSIS, ROUTINE W REFLEX MICROSCOPIC
Bilirubin Urine: NEGATIVE
GLUCOSE, UA: NEGATIVE mg/dL
HGB URINE DIPSTICK: NEGATIVE
Ketones, ur: NEGATIVE mg/dL
Leukocytes, UA: NEGATIVE
Nitrite: NEGATIVE
PH: 6.5 (ref 5.0–8.0)
PROTEIN: NEGATIVE mg/dL
Specific Gravity, Urine: 1.02 (ref 1.005–1.030)

## 2015-09-10 LAB — CBC
HEMATOCRIT: 29.4 % — AB (ref 36.0–46.0)
Hemoglobin: 9.4 g/dL — ABNORMAL LOW (ref 12.0–15.0)
MCH: 21.2 pg — ABNORMAL LOW (ref 26.0–34.0)
MCHC: 32 g/dL (ref 30.0–36.0)
MCV: 66.4 fL — AB (ref 78.0–100.0)
Platelets: 214 10*3/uL (ref 150–400)
RBC: 4.43 MIL/uL (ref 3.87–5.11)
RDW: 14.8 % (ref 11.5–15.5)
WBC: 9 10*3/uL (ref 4.0–10.5)

## 2015-09-10 LAB — PROTEIN / CREATININE RATIO, URINE
Creatinine, Urine: 132 mg/dL
PROTEIN CREATININE RATIO: 0.15 mg/mg{creat} (ref 0.00–0.15)
TOTAL PROTEIN, URINE: 20 mg/dL

## 2015-09-10 MED ORDER — ACETAMINOPHEN 500 MG PO TABS
1000.0000 mg | ORAL_TABLET | Freq: Once | ORAL | Status: AC
  Administered 2015-09-10: 1000 mg via ORAL
  Filled 2015-09-10: qty 2

## 2015-09-10 NOTE — MAU Note (Signed)
Pt presents complaining of increased BP's at home. Takes labetalol  3x day for GHTN. Denies vision changes. HA relived with tylenol. Denies leaking or bleeding. Reports good fetal movement.

## 2015-09-10 NOTE — MAU Provider Note (Signed)
History     CSN: [id number]  Arrival date and time: 09/10/15 2208   First Provider Initiated Contact with Patient 09/10/15 2309      Chief Complaint  Patient presents with  . Hypertension   HPI Comments: [name] is a 35 y.o. [id number] at [redacted]w[redacted]d who presents today with hypertension. She has CHTN, and was on HCTZ prior to pregnancy. She is now on labetalol  TID. She states that when she took her blood pressure at home it was: 137/80, 152/96, 146/94, 162/91, 165/97, 172/103. She denies any contractions, VB or LOF. She states that the fetus has been moving normally.   Hypertension This is a new problem. The current episode started today. The problem has been gradually worsening since onset. The problem is uncontrolled. Associated symptoms include headaches (on the right side, took tyelnol, and that helped. ). Pertinent negatives include no blurred vision, chest pain or shortness of breath. Associated agents: pregnancy  Past treatments include beta blockers (  TID ). The current treatment provides mild improvement. There are no compliance problems.      Past Medical History  Diagnosis Date  . Hypertension     Past Surgical History  Procedure Laterality Date  . Cesarean section    . Therapeutic abortion      X4    Family History  Problem Relation Age of Onset  . Diabetes Mother   . Hypertension Mother   . Hypertension Sister     Social History  Substance Use Topics  . Smoking status: Never Smoker   . Smokeless tobacco: None  . Alcohol Use: No    Allergies: No Known Allergies  Prescriptions prior to admission  Medication Sig Dispense Refill Last Dose  . acetaminophen (TYLENOL) 500 MG tablet Take 1,000 mg by mouth every 6 (six) hours as needed for mild pain or headache.   07/29/2015 at Unknown time  . labetalol (NORMODYNE) 200 MG tablet Take 200 mg by mouth 2 (two) times daily.   07/30/2015 at 1100  . omeprazole (PRILOSEC) 20 MG capsule Take 20 mg by mouth  daily.   07/30/2015 at Unknown time  . Prenatal Vit-Fe Fumarate-FA (PRENATAL MULTIVITAMIN) TABS tablet Take 1 tablet by mouth daily at 12 noon.   Past Month at Unknown time    Review of Systems  Eyes: Negative for blurred vision.  Respiratory: Negative for shortness of breath.   Cardiovascular: Negative for chest pain.  Gastrointestinal: Negative for abdominal pain.  Neurological: Positive for headaches (on the right side, took tyelnol, and that helped. ).   Physical Exam   Blood pressure 139/92, pulse 89, last menstrual period 12/27/2014.  Physical Exam  Nursing note and vitals reviewed. Constitutional: She is oriented to person, place, and time. She appears well-developed and well-nourished. No distress.  HENT:  Head: Normocephalic.  Cardiovascular: Normal rate.   Respiratory: Effort normal.  GI: Soft. There is no tenderness. There is no rebound.  Neurological: She is alert and oriented to person, place, and time.  Skin: Skin is warm and dry.  Psychiatric: She has a normal mood and affect.   Results for orders placed or performed during the hospital encounter of 09/10/15 (from the past 24 hour(s))  Urinalysis, Routine w reflex microscopic (not at [facility])     Status: None   Collection Time: 09/10/15 10:18 PM  Result Value Ref Range   Color, Urine YELLOW YELLOW   APPearance CLEAR CLEAR   Specific Gravity, Urine 1.020 1.005 - 1.030   pH 6.5 5.0 -  8.0   Glucose, UA NEGATIVE NEGATIVE mg/dL   Hgb urine dipstick NEGATIVE NEGATIVE   Bilirubin Urine NEGATIVE NEGATIVE   Ketones, ur NEGATIVE NEGATIVE mg/dL   Protein, ur NEGATIVE NEGATIVE mg/dL   Nitrite NEGATIVE NEGATIVE   Leukocytes, UA NEGATIVE NEGATIVE  Protein / creatinine ratio, urine     Status: None   Collection Time: 09/10/15 10:18 PM  Result Value Ref Range   Creatinine, Urine 132.00 mg/dL   Total Protein, Urine 20 mg/dL   Protein Creatinine Ratio 0.15 0.00 - 0.15 mg/mg[Cre]  CBC     Status: Abnormal   Collection Time:  09/10/15 11:03 PM  Result Value Ref Range   WBC 9.0 4.0 - 10.5 K/uL   RBC 4.43 3.87 - 5.11 MIL/uL   Hemoglobin 9.4 (L) 12.0 - 15.0 g/dL   HCT 16.1 (L) 09.6 - 04.5 %   MCV 66.4 (L) 78.0 - 100.0 fL   MCH 21.2 (L) 26.0 - 34.0 pg   MCHC 32.0 30.0 - 36.0 g/dL   RDW 40.9 81.1 - 91.4 %   Platelets 214 150 - 400 K/uL  Comprehensive metabolic panel     Status: Abnormal   Collection Time: 09/10/15 11:03 PM  Result Value Ref Range   Sodium 135 135 - 145 mmol/L   Potassium 3.9 3.5 - 5.1 mmol/L   Chloride 108 101 - 111 mmol/L   CO2 21 (L) 22 - 32 mmol/L   Glucose, Bld 77 65 - 99 mg/dL   BUN 10 6 - 20 mg/dL   Creatinine, Ser 7.82 0.44 - 1.00 mg/dL   Calcium 9.2 8.9 - 95.6 mg/dL   Total Protein 6.3 (L) 6.5 - 8.1 g/dL   Albumin 2.8 (L) 3.5 - 5.0 g/dL   AST 21 15 - 41 U/L   ALT 10 (L) 14 - 54 U/L   Alkaline Phosphatase 172 (H) 38 - 126 U/L   Total Bilirubin 0.5 0.3 - 1.2 mg/dL   GFR calc non Af Amer >60 >60 mL/min   GFR calc Af Amer >60 >60 mL/min   Anion gap 6 5 - 15    FHT: 140, moderate with 15x15 accels, no decels Toco: no UCs  MAU Course  Procedures  MDM 0015: Patient states that her headache is resolved after having tylenol 0021: D/W Dr. Langston Masker, reviewed labs, PE, FHR tracing, vitals. Ok for DC home FU early next week if not already scheduled.   Assessment and Plan   1. Chronic hypertension in pregnancy, second trimester    DC home Comfort measures reviewed  3rd Trimester precautions  Pre-eclampsia warning signs  PTL precautions  Fetal kick counts RX: no change in current meds  Return to MAU as needed   Follow-up Information    Follow up with MORRIS, MEGAN, DO.   Specialty:  Obstetrics and Gynecology   Why:  Monday or Tuesday of next week    Contact information:   [address] n [address] [place] [address] [phone]        Tawnya Crook 09/10/2015, 11:19 PM

## 2015-09-11 DIAGNOSIS — O10912 Unspecified pre-existing hypertension complicating pregnancy, second trimester: Secondary | ICD-10-CM

## 2015-09-11 NOTE — Discharge Instructions (Signed)

## 2015-09-15 ENCOUNTER — Encounter (HOSPITAL_COMMUNITY)
Admission: RE | Admit: 2015-09-15 | Discharge: 2015-09-15 | Disposition: A | Discharge status: Home / Self Care | Payer: 59 | Source: Ambulatory Visit | Attending: Obstetrics and Gynecology | Admitting: Obstetrics and Gynecology

## 2015-09-15 ENCOUNTER — Encounter (HOSPITAL_COMMUNITY): Payer: Self-pay

## 2015-09-15 HISTORY — DX: Personal history of other medical treatment: Z92.89

## 2015-09-15 HISTORY — DX: Gastro-esophageal reflux disease without esophagitis: K21.9

## 2015-09-15 HISTORY — DX: Anemia, unspecified: D64.9

## 2015-09-15 LAB — COMPREHENSIVE METABOLIC PANEL
ALBUMIN: 2.8 g/dL — AB (ref 3.5–5.0)
ALT: 9 U/L — AB (ref 14–54)
AST: 21 U/L (ref 15–41)
Alkaline Phosphatase: 178 U/L — ABNORMAL HIGH (ref 38–126)
Anion gap: 7 (ref 5–15)
BUN: 10 mg/dL (ref 6–20)
CHLORIDE: 108 mmol/L (ref 101–111)
CO2: 21 mmol/L — AB (ref 22–32)
Calcium: 9.3 mg/dL (ref 8.9–10.3)
Creatinine, Ser: 0.66 mg/dL (ref 0.44–1.00)
GFR calc Af Amer: >60 mL/min (ref >60)
GLUCOSE: 82 mg/dL (ref 65–99)
POTASSIUM: 3.8 mmol/L (ref 3.5–5.1)
SODIUM: 136 mmol/L (ref 135–145)
Total Bilirubin: 0.5 mg/dL (ref 0.3–1.2)
Total Protein: 6.9 g/dL (ref 6.5–8.1)

## 2015-09-15 LAB — CBC
HCT: 30.7 % — ABNORMAL LOW (ref 36.0–46.0)
Hemoglobin: 9.6 g/dL — ABNORMAL LOW (ref 12.0–15.0)
MCH: 21 pg — ABNORMAL LOW (ref 26.0–34.0)
MCHC: 31.3 g/dL (ref 30.0–36.0)
MCV: 67 fL — ABNORMAL LOW (ref 78.0–100.0)
PLATELETS: 182 10*3/uL (ref 150–400)
RBC: 4.58 MIL/uL (ref 3.87–5.11)
RDW: 15.2 % (ref 11.5–15.5)
WBC: 7.2 10*3/uL (ref 4.0–10.5)

## 2015-09-15 LAB — ABO/RH: ABO/RH(D): O POS

## 2015-09-15 NOTE — Patient Instructions (Addendum)
Your procedure is scheduled on:  Thursday, September 17, 2015  Enter through the [employer] of [facility] at:  11:30 AM  Pick up the phone at the desk and dial [phone].  Call this number if you have problems the morning of surgery: [phone].  Remember:  Do NOT eat food:  After Midnight Wednesday  Do NOT drink clear liquids after:  9:00 AM Day of surgery  Take these medicines the morning of surgery with a SIP OF WATER:  Labetalol, Prilosec  Do NOT wear jewelry (body piercing), metal hair clips/bobby pins, or nail polish. Do NOT wear lotions, powders, or perfumes.  You may wear deoderant. Do NOT shave for 48 hours prior to surgery. Do NOT bring valuables to the hospital.  Leave suitcase in car.  After surgery it may be brought to your room.  For patients admitted to the hospital, checkout time is 11:00 AM the day of discharge.

## 2015-09-16 LAB — RPR: RPR Ser Ql: NONREACTIVE

## 2015-09-16 MED ORDER — DEXTROSE 5 % IV SOLN
3.0000 g | INTRAVENOUS | Status: AC
  Administered 2015-09-17: 3 g via INTRAVENOUS
  Filled 2015-09-16: qty 3000

## 2015-09-16 NOTE — H&P (Signed)
[name] is a 35 y.o. female presenting for repeat cesarean section and BTL. Pregnancy complicated by 2 previous cesarean sections. Also complicated by [facility] on labetalol  tid.U/S in office on 09/14/15 Vtx, EFW 3323gm (7# 5oz), AFI 14.9, BPP 8/8.  GBBS +  Maternal Medical History:  Fetal activity: Perceived fetal activity is normal.      OB History    Gravida Para Term Preterm AB TAB SAB Ectopic Multiple Living   Past Medical History  Diagnosis Date  . Hypertension   . GERD (gastroesophageal reflux disease)   . Anemia   . History of blood transfusion    Past Surgical History  Procedure Laterality Date  . Cesarean section    . Therapeutic abortion      X4   Family History: family history includes Diabetes in her mother; Hypertension in her mother and sister. Social History:  reports that she has never smoked. She has never used smokeless tobacco. She reports that she drinks alcohol. She reports that she does not use illicit drugs.   Prenatal Transfer Tool  Maternal Diabetes: No Genetic Screening: Normal Maternal Ultrasounds/Referrals: Normal Fetal Ultrasounds or other Referrals:  None Maternal Substance Abuse:  No Significant Maternal Medications:  Meds include: Other: labetalol Significant Maternal Lab Results:  None Other Comments:  None  Review of Systems  Eyes: Negative for blurred vision.  Gastrointestinal: Negative for abdominal pain.  Neurological: Negative for headaches.      Last menstrual period 12/27/2014. Maternal Exam:  Abdomen: Fetal presentation: vertex     Physical Exam  Cardiovascular: Normal rate and regular rhythm.   Respiratory: Effort normal and breath sounds normal.  GI: Soft. There is no tenderness.  Neurological: She has normal reflexes.    Prenatal labs: ABO, Rh: --/--/O POS, O POS (02/28 1015) Antibody: NEG (02/28 1015) Rubella: Immune (08/25 0000) RPR: Non Reactive (02/28 1015)  HBsAg: Negative  (08/25 0000)  HIV: Non-reactive (08/25 0000)  GBS:     Assessment/Plan: 35 yo G6P2 for repeat cesarean section and BTL CHTN Risks reviewed including infection, organ damage, bleeding/transfusion-HIV/Hep, DVT/PE, pneumonia. D/W BTL-permanence, failure rate, increased ectopic risk, alternatives.   [name],[name] 09/16/2015, 5:54 PM

## 2015-09-17 ENCOUNTER — Inpatient Hospital Stay (HOSPITAL_COMMUNITY): Payer: 59 | Admitting: Certified Registered Nurse Anesthetist

## 2015-09-17 ENCOUNTER — Encounter (HOSPITAL_COMMUNITY)
Admission: RE | Disposition: A | Discharge status: Home / Self Care | Payer: Self-pay | Source: Ambulatory Visit | Attending: Obstetrics and Gynecology

## 2015-09-17 ENCOUNTER — Inpatient Hospital Stay (HOSPITAL_COMMUNITY)
Admission: RE | Admit: 2015-09-17 | Discharge: 2015-09-21 | DRG: 765 | Disposition: A | Discharge status: Home / Self Care | Payer: 59 | Source: Ambulatory Visit | Attending: Obstetrics and Gynecology | Admitting: Obstetrics and Gynecology

## 2015-09-17 ENCOUNTER — Encounter (HOSPITAL_COMMUNITY): Payer: Self-pay | Admitting: *Deleted

## 2015-09-17 DIAGNOSIS — Z302 Encounter for sterilization: Secondary | ICD-10-CM | POA: Diagnosis not present

## 2015-09-17 DIAGNOSIS — O34211 Maternal care for low transverse scar from previous cesarean delivery: Principal | ICD-10-CM | POA: Diagnosis present

## 2015-09-17 DIAGNOSIS — Z3A37 37 weeks gestation of pregnancy: Secondary | ICD-10-CM

## 2015-09-17 DIAGNOSIS — O1002 Pre-existing essential hypertension complicating childbirth: Secondary | ICD-10-CM | POA: Diagnosis present

## 2015-09-17 DIAGNOSIS — O9962 Diseases of the digestive system complicating childbirth: Secondary | ICD-10-CM | POA: Diagnosis present

## 2015-09-17 DIAGNOSIS — Z8249 Family history of ischemic heart disease and other diseases of the circulatory system: Secondary | ICD-10-CM

## 2015-09-17 DIAGNOSIS — Z98891 History of uterine scar from previous surgery: Secondary | ICD-10-CM

## 2015-09-17 DIAGNOSIS — Z833 Family history of diabetes mellitus: Secondary | ICD-10-CM

## 2015-09-17 DIAGNOSIS — O99824 Streptococcus B carrier state complicating childbirth: Secondary | ICD-10-CM | POA: Diagnosis present

## 2015-09-17 DIAGNOSIS — D62 Acute posthemorrhagic anemia: Secondary | ICD-10-CM | POA: Diagnosis not present

## 2015-09-17 DIAGNOSIS — O9081 Anemia of the puerperium: Secondary | ICD-10-CM | POA: Diagnosis present

## 2015-09-17 DIAGNOSIS — K219 Gastro-esophageal reflux disease without esophagitis: Secondary | ICD-10-CM | POA: Diagnosis present

## 2015-09-17 LAB — PREPARE RBC (CROSSMATCH)

## 2015-09-17 SURGERY — Surgical Case
Anesthesia: Spinal | Laterality: Bilateral

## 2015-09-17 MED ORDER — MEPERIDINE HCL 25 MG/ML IJ SOLN: 6.2500 mg | INTRAMUSCULAR | Status: DC | PRN

## 2015-09-17 MED ORDER — NALBUPHINE HCL 10 MG/ML IJ SOLN
INTRAMUSCULAR | Status: AC
  Filled 2015-09-17: qty 1

## 2015-09-17 MED ORDER — LANOLIN HYDROUS EX OINT: 1.0000 "application " | TOPICAL_OINTMENT | CUTANEOUS | Status: DC | PRN

## 2015-09-17 MED ORDER — LACTATED RINGERS IV SOLN
INTRAVENOUS | Status: DC
Start: 2015-09-17 — End: 2015-09-17
  Administered 2015-09-17: 14:00:00 via INTRAVENOUS

## 2015-09-17 MED ORDER — ONDANSETRON HCL 4 MG/2ML IJ SOLN
INTRAMUSCULAR | Status: AC
  Filled 2015-09-17: qty 2

## 2015-09-17 MED ORDER — OXYTOCIN 10 UNIT/ML IJ SOLN
40.0000 [IU] | INTRAMUSCULAR | Status: DC | PRN
  Administered 2015-09-17: 40 [IU] via INTRAVENOUS

## 2015-09-17 MED ORDER — LACTATED RINGERS IV SOLN: INTRAVENOUS | Status: DC

## 2015-09-17 MED ORDER — FENTANYL CITRATE (PF) 100 MCG/2ML IJ SOLN
INTRAMUSCULAR | Status: AC
  Filled 2015-09-17: qty 2

## 2015-09-17 MED ORDER — KETOROLAC TROMETHAMINE 30 MG/ML IJ SOLN: 30.0000 mg | Freq: Four times a day (QID) | INTRAMUSCULAR | Status: AC | PRN

## 2015-09-17 MED ORDER — ONDANSETRON HCL 4 MG/2ML IJ SOLN
INTRAMUSCULAR | Status: DC | PRN
  Administered 2015-09-17: 4 mg via INTRAVENOUS

## 2015-09-17 MED ORDER — MORPHINE SULFATE (PF) 0.5 MG/ML IJ SOLN
INTRAMUSCULAR | Status: DC | PRN
  Administered 2015-09-17: .1 mg via EPIDURAL

## 2015-09-17 MED ORDER — NALBUPHINE HCL 10 MG/ML IJ SOLN: 5.0000 mg | Freq: Once | INTRAMUSCULAR | Status: DC | PRN

## 2015-09-17 MED ORDER — TETANUS-DIPHTH-ACELL PERTUSSIS 5-2.5-18.5 LF-MCG/0.5 IM SUSP: 0.5000 mL | Freq: Once | INTRAMUSCULAR | Status: DC

## 2015-09-17 MED ORDER — OXYCODONE-ACETAMINOPHEN 5-325 MG PO TABS
1.0000 | ORAL_TABLET | ORAL | Status: DC | PRN
  Administered 2015-09-19 – 2015-09-20 (×2): 1 via ORAL
  Filled 2015-09-17 (×3): qty 1

## 2015-09-17 MED ORDER — SIMETHICONE 80 MG PO CHEW
80.0000 mg | CHEWABLE_TABLET | ORAL | Status: DC | PRN
  Administered 2015-09-18 – 2015-09-20 (×3): 80 mg via ORAL
  Filled 2015-09-17: qty 1

## 2015-09-17 MED ORDER — OXYTOCIN 10 UNIT/ML IJ SOLN: 2.5000 [IU]/h | INTRAVENOUS | Status: AC

## 2015-09-17 MED ORDER — OXYTOCIN 10 UNIT/ML IJ SOLN
INTRAMUSCULAR | Status: AC
  Filled 2015-09-17: qty 4

## 2015-09-17 MED ORDER — LABETALOL HCL 5 MG/ML IV SOLN
10.0000 mg | INTRAVENOUS | Status: DC | PRN
  Administered 2015-09-17 (×2): 10 mg via INTRAVENOUS

## 2015-09-17 MED ORDER — NALBUPHINE HCL 10 MG/ML IJ SOLN: 5.0000 mg | INTRAMUSCULAR | Status: DC | PRN

## 2015-09-17 MED ORDER — IBUPROFEN 600 MG PO TABS
600.0000 mg | ORAL_TABLET | Freq: Four times a day (QID) | ORAL | Status: DC
  Administered 2015-09-17 – 2015-09-21 (×14): 600 mg via ORAL
  Filled 2015-09-17 (×14): qty 1

## 2015-09-17 MED ORDER — SODIUM CHLORIDE 0.9% FLUSH: 3.0000 mL | INTRAVENOUS | Status: DC | PRN

## 2015-09-17 MED ORDER — ACETAMINOPHEN 325 MG PO TABS: 650.0000 mg | ORAL_TABLET | ORAL | Status: DC | PRN

## 2015-09-17 MED ORDER — SENNOSIDES-DOCUSATE SODIUM 8.6-50 MG PO TABS
2.0000 | ORAL_TABLET | ORAL | Status: DC
  Administered 2015-09-17 – 2015-09-20 (×4): 2 via ORAL
  Filled 2015-09-17 (×4): qty 2

## 2015-09-17 MED ORDER — SIMETHICONE 80 MG PO CHEW
80.0000 mg | CHEWABLE_TABLET | Freq: Three times a day (TID) | ORAL | Status: DC
  Administered 2015-09-18 – 2015-09-21 (×10): 80 mg via ORAL
  Filled 2015-09-17 (×10): qty 1

## 2015-09-17 MED ORDER — DIPHENHYDRAMINE HCL 50 MG/ML IJ SOLN: 12.5000 mg | INTRAMUSCULAR | Status: DC | PRN

## 2015-09-17 MED ORDER — FENTANYL CITRATE (PF) 100 MCG/2ML IJ SOLN
25.0000 ug | INTRAMUSCULAR | Status: DC | PRN
  Administered 2015-09-17: 25 ug via INTRAVENOUS
  Administered 2015-09-17: 50 ug via INTRAVENOUS

## 2015-09-17 MED ORDER — KETOROLAC TROMETHAMINE 30 MG/ML IJ SOLN
INTRAMUSCULAR | Status: AC
  Administered 2015-09-17: 30 mg
  Filled 2015-09-17: qty 1

## 2015-09-17 MED ORDER — SODIUM CHLORIDE 0.9 % IR SOLN
Status: DC | PRN
  Administered 2015-09-17: 1000 mL

## 2015-09-17 MED ORDER — OXYCODONE-ACETAMINOPHEN 5-325 MG PO TABS
2.0000 | ORAL_TABLET | ORAL | Status: DC | PRN
  Administered 2015-09-18 – 2015-09-21 (×11): 2 via ORAL
  Filled 2015-09-17 (×12): qty 2

## 2015-09-17 MED ORDER — NALBUPHINE HCL 10 MG/ML IJ SOLN
5.0000 mg | INTRAMUSCULAR | Status: DC | PRN
  Administered 2015-09-17: 5 mg via INTRAVENOUS

## 2015-09-17 MED ORDER — MENTHOL 3 MG MT LOZG: 1.0000 | LOZENGE | OROMUCOSAL | Status: DC | PRN

## 2015-09-17 MED ORDER — SIMETHICONE 80 MG PO CHEW
80.0000 mg | CHEWABLE_TABLET | ORAL | Status: DC
  Administered 2015-09-17 – 2015-09-20 (×4): 80 mg via ORAL
  Filled 2015-09-17 (×4): qty 1

## 2015-09-17 MED ORDER — LACTATED RINGERS IV SOLN
INTRAVENOUS | Status: DC
  Administered 2015-09-17 – 2015-09-18 (×2): via INTRAVENOUS

## 2015-09-17 MED ORDER — PRENATAL MULTIVITAMIN CH
1.0000 | ORAL_TABLET | Freq: Every day | ORAL | Status: DC
  Administered 2015-09-18 – 2015-09-20 (×3): 1 via ORAL
  Filled 2015-09-17 (×3): qty 1

## 2015-09-17 MED ORDER — NALOXONE HCL 2 MG/2ML IJ SOSY: 1.0000 ug/kg/h | PREFILLED_SYRINGE | INTRAVENOUS | Status: DC | PRN

## 2015-09-17 MED ORDER — BUPIVACAINE IN DEXTROSE 0.75-8.25 % IT SOLN
INTRATHECAL | Status: DC | PRN
Start: 2015-09-17 — End: 2015-09-17
  Administered 2015-09-17: 1.4 mL via INTRATHECAL

## 2015-09-17 MED ORDER — SCOPOLAMINE 1 MG/3DAYS TD PT72
MEDICATED_PATCH | TRANSDERMAL | Status: AC
  Administered 2015-09-17: 1.5 mg via TRANSDERMAL
  Filled 2015-09-17: qty 1

## 2015-09-17 MED ORDER — WITCH HAZEL-GLYCERIN EX PADS: 1.0000 "application " | MEDICATED_PAD | CUTANEOUS | Status: DC | PRN

## 2015-09-17 MED ORDER — LABETALOL HCL 200 MG PO TABS
200.0000 mg | ORAL_TABLET | Freq: Two times a day (BID) | ORAL | Status: DC
  Administered 2015-09-17 – 2015-09-20 (×5): 200 mg via ORAL
  Filled 2015-09-17 (×6): qty 1

## 2015-09-17 MED ORDER — SCOPOLAMINE 1 MG/3DAYS TD PT72
1.0000 | MEDICATED_PATCH | Freq: Once | TRANSDERMAL | Status: DC
  Administered 2015-09-17: 1.5 mg via TRANSDERMAL

## 2015-09-17 MED ORDER — MORPHINE SULFATE (PF) 0.5 MG/ML IJ SOLN
INTRAMUSCULAR | Status: AC
  Filled 2015-09-17: qty 10

## 2015-09-17 MED ORDER — ZOLPIDEM TARTRATE 5 MG PO TABS: 5.0000 mg | ORAL_TABLET | Freq: Every evening | ORAL | Status: DC | PRN

## 2015-09-17 MED ORDER — ONDANSETRON HCL 4 MG/2ML IJ SOLN
4.0000 mg | Freq: Three times a day (TID) | INTRAMUSCULAR | Status: DC | PRN
  Administered 2015-09-17: 4 mg via INTRAVENOUS
  Filled 2015-09-17: qty 2

## 2015-09-17 MED ORDER — PHENYLEPHRINE 40 MCG/ML (10ML) SYRINGE FOR IV PUSH (FOR BLOOD PRESSURE SUPPORT)
PREFILLED_SYRINGE | INTRAVENOUS | Status: AC
  Filled 2015-09-17: qty 10

## 2015-09-17 MED ORDER — DIPHENHYDRAMINE HCL 25 MG PO CAPS: 25.0000 mg | ORAL_CAPSULE | Freq: Four times a day (QID) | ORAL | Status: DC | PRN

## 2015-09-17 MED ORDER — PHENYLEPHRINE 8 MG IN D5W 100 ML (0.08MG/ML) PREMIX OPTIME
INJECTION | INTRAVENOUS | Status: AC
  Filled 2015-09-17: qty 100

## 2015-09-17 MED ORDER — LABETALOL HCL 200 MG PO TABS: 200.0000 mg | ORAL_TABLET | Freq: Two times a day (BID) | ORAL | Status: DC

## 2015-09-17 MED ORDER — LACTATED RINGERS IV SOLN
INTRAVENOUS | Status: DC
  Administered 2015-09-17 (×2): via INTRAVENOUS

## 2015-09-17 MED ORDER — DIPHENHYDRAMINE HCL 25 MG PO CAPS
25.0000 mg | ORAL_CAPSULE | ORAL | Status: DC | PRN
  Administered 2015-09-18 – 2015-09-19 (×2): 25 mg via ORAL
  Filled 2015-09-17 (×2): qty 1

## 2015-09-17 MED ORDER — FENTANYL CITRATE (PF) 100 MCG/2ML IJ SOLN
INTRAMUSCULAR | Status: DC | PRN
  Administered 2015-09-17: 12.5 ug via INTRATHECAL

## 2015-09-17 MED ORDER — LABETALOL HCL 5 MG/ML IV SOLN
INTRAVENOUS | Status: AC
  Administered 2015-09-17: 10 mg via INTRAVENOUS
  Filled 2015-09-17: qty 4

## 2015-09-17 MED ORDER — SCOPOLAMINE 1 MG/3DAYS TD PT72: 1.0000 | MEDICATED_PATCH | Freq: Once | TRANSDERMAL | Status: DC

## 2015-09-17 MED ORDER — NALOXONE HCL 0.4 MG/ML IJ SOLN: 0.4000 mg | INTRAMUSCULAR | Status: DC | PRN

## 2015-09-17 MED ORDER — DIBUCAINE 1 % RE OINT: 1.0000 "application " | TOPICAL_OINTMENT | RECTAL | Status: DC | PRN

## 2015-09-17 MED ORDER — PHENYLEPHRINE 8 MG IN D5W 100 ML (0.08MG/ML) PREMIX OPTIME
INJECTION | INTRAVENOUS | Status: DC | PRN
  Administered 2015-09-17: 50 ug/min via INTRAVENOUS

## 2015-09-17 SURGICAL SUPPLY — 39 items
APL SKNCLS STERI-STRIP NONHPOA (GAUZE/BANDAGES/DRESSINGS) ×1
BENZOIN TINCTURE PRP APPL 2/3 (GAUZE/BANDAGES/DRESSINGS) ×2 IMPLANT
CLAMP CORD UMBIL (MISCELLANEOUS) IMPLANT
CLOSURE STERI STRIP 1/2 X4 (GAUZE/BANDAGES/DRESSINGS) ×2 IMPLANT
CLOSURE WOUND 1/2 X4 (GAUZE/BANDAGES/DRESSINGS)
CLOTH BEACON ORANGE TIMEOUT ST (SAFETY) ×3 IMPLANT
CONTAINER PREFILL 10% NBF 15ML (MISCELLANEOUS) IMPLANT
DRSG OPSITE POSTOP 4X10 (GAUZE/BANDAGES/DRESSINGS) ×3 IMPLANT
DURAPREP 26ML APPLICATOR (WOUND CARE) ×3 IMPLANT
ELECT REM PT RETURN 9FT ADLT (ELECTROSURGICAL) ×3
ELECTRODE REM PT RTRN 9FT ADLT (ELECTROSURGICAL) ×1 IMPLANT
EXTRACTOR VACUUM M CUP 4 TUBE (SUCTIONS) ×1 IMPLANT
EXTRACTOR VACUUM M CUP 4' TUBE (SUCTIONS) ×1
GLOVE BIO SURGEON STRL SZ8 (GLOVE) ×3 IMPLANT
GLOVE BIOGEL PI IND STRL 7.0 (GLOVE) ×1 IMPLANT
GLOVE BIOGEL PI INDICATOR 7.0 (GLOVE) ×2
GOWN STRL REUS W/TWL LRG LVL3 (GOWN DISPOSABLE) ×6 IMPLANT
KIT ABG SYR 3ML LUER SLIP (SYRINGE) ×3 IMPLANT
LIQUID BAND (GAUZE/BANDAGES/DRESSINGS) IMPLANT
NDL HYPO 25X5/8 SAFETYGLIDE (NEEDLE) ×1 IMPLANT
NEEDLE HYPO 22GX1.5 SAFETY (NEEDLE) ×3 IMPLANT
NEEDLE HYPO 25X5/8 SAFETYGLIDE (NEEDLE) ×3 IMPLANT
NS IRRIG 1000ML POUR BTL (IV SOLUTION) ×3 IMPLANT
PACK C SECTION WH (CUSTOM PROCEDURE TRAY) ×3 IMPLANT
PAD ABD 7.5X8 STRL (GAUZE/BANDAGES/DRESSINGS) ×4 IMPLANT
PAD OB MATERNITY 4.3X12.25 (PERSONAL CARE ITEMS) ×3 IMPLANT
PENCIL SMOKE EVAC W/HOLSTER (ELECTROSURGICAL) ×3 IMPLANT
SPONGE GAUZE 4X4 12PLY STER LF (GAUZE/BANDAGES/DRESSINGS) ×3 IMPLANT
STRIP CLOSURE SKIN 1/2X4 (GAUZE/BANDAGES/DRESSINGS) IMPLANT
SUT MNCRL 0 VIOLET CTX 36 (SUTURE) ×4 IMPLANT
SUT MONOCRYL 0 CTX 36 (SUTURE) ×8
SUT PDS AB 0 CTX 60 (SUTURE) ×5 IMPLANT
SUT PLAIN 0 NONE (SUTURE) IMPLANT
SUT PLAIN 2 0 (SUTURE)
SUT PLAIN 2 0 XLH (SUTURE) ×3 IMPLANT
SUT PLAIN ABS 2-0 CT1 27XMFL (SUTURE) IMPLANT
SUT VIC AB 4-0 KS 27 (SUTURE) ×3 IMPLANT
TOWEL OR 17X24 6PK STRL BLUE (TOWEL DISPOSABLE) ×3 IMPLANT
TRAY FOLEY CATH SILVER 14FR (SET/KITS/TRAYS/PACK) ×3 IMPLANT

## 2015-09-17 NOTE — Transfer of Care (Signed)
Immediate Anesthesia Transfer of Care Note  Patient: [name]  Procedure(s) Performed: Procedure(s): REPEAT CESAREAN SECTION WITH BILATERAL TUBAL LIGATION (Bilateral)  Patient Location: PACU  Anesthesia Type:Spinal  Level of Consciousness: awake and alert   Airway & Oxygen Therapy: Patient Spontanous Breathing  Post-op Assessment: Report given to RN and Post -op Vital signs reviewed and stable  Post vital signs: Reviewed  Last Vitals:  Filed Vitals:   09/17/15 1131  BP: 152/100  Pulse: 95  Temp: 36.7 C  Resp: 20    Complications: No apparent anesthesia complications

## 2015-09-17 NOTE — Brief Op Note (Signed)
09/17/2015  2:12 PM  PATIENT:  [name]  35 y.o. female  PRE-OPERATIVE DIAGNOSIS:  previous c-section, chronic hypertension, desires sterilization  POST-OPERATIVE DIAGNOSIS:  previous c-section, chronic hypertension, desires sterilization  PROCEDURE:  Procedure(s): REPEAT CESAREAN SECTION WITH BILATERAL TUBAL LIGATION (Bilateral)  SURGEON:  Surgeon(s) and Role:    * Harold Hedge, MD - Primary  PHYSICIAN ASSISTANT:   ASSISTANTS: none   ANESTHESIA:   spinal  EBL:  Total I/O In: 2000 [I.V.:2000] Out: 900 [Urine:100; Blood:800]  BLOOD ADMINISTERED:none  DRAINS: Urinary Catheter (Foley)   LOCAL MEDICATIONS USED:  NONE  SPECIMEN:  Source of Specimen:  placenta, bilateral fallopian tube segments  DISPOSITION OF SPECIMEN:  PATHOLOGY  COUNTS:  YES  TOURNIQUET:  * No tourniquets in log *  DICTATION: .Other Dictation: Dictation Number [id number]  PLAN OF CARE: Admit to inpatient   PATIENT DISPOSITION:  PACU - hemodynamically stable.   Delay start of Pharmacological VTE agent (>24hrs) due to surgical blood loss or risk of bleeding: not applicable

## 2015-09-17 NOTE — Anesthesia Preprocedure Evaluation (Addendum)
Anesthesia Evaluation  Patient identified by MRN, date of birth, ID band Patient awake    Reviewed: Allergy & Precautions, NPO status , Patient's Chart, lab work & pertinent test results  Airway Mallampati: II  TM Distance: >3 FB Neck ROM: Full    Dental no notable dental hx.    Pulmonary neg pulmonary ROS,    Pulmonary exam normal breath sounds clear to auscultation       Cardiovascular hypertension, Pt. on medications and Pt. on home beta blockers Normal cardiovascular exam Rhythm:Regular Rate:Normal     Neuro/Psych negative neurological ROS  negative psych ROS   GI/Hepatic negative GI ROS, Neg liver ROS,   Endo/Other  negative endocrine ROS  Renal/GU negative Renal ROS  negative genitourinary   Musculoskeletal negative musculoskeletal ROS (+)   Abdominal   Peds negative pediatric ROS (+)  Hematology negative hematology ROS (+)   Anesthesia Other Findings   Reproductive/Obstetrics (+) Pregnancy                            Anesthesia Physical Anesthesia Plan  ASA: II  Anesthesia Plan: Spinal   Post-op Pain Management:    Induction: Intravenous  Airway Management Planned: Natural Airway  Additional Equipment:   Intra-op Plan:   Post-operative Plan: Extubation in OR  Informed Consent: I have reviewed the patients History and Physical, chart, labs and discussed the procedure including the risks, benefits and alternatives for the proposed anesthesia with the patient or authorized representative who has indicated his/her understanding and acceptance.   Dental advisory given  Plan Discussed with: CRNA  Anesthesia Plan Comments:         Anesthesia Quick Evaluation

## 2015-09-17 NOTE — Op Note (Signed)
[name], [name]             ACCOUNT NO.:  [id number]  MEDICAL RECORD NO.:  [id number]  LOCATION:  9130                          FACILITY:  [facility]  PHYSICIAN:  Guy Sandifer. Henderson Cloud, M.D. DATE OF BIRTH:  [birth year]  DATE OF PROCEDURE:  09/17/2015 DATE OF DISCHARGE:                              OPERATIVE REPORT   PREOPERATIVE DIAGNOSES: 1. Previous cesarean section, desires repeat. 2. Desires permanent sterilization. 3. Chronic hypertension.  POSTOPERATIVE DIAGNOSES: 1. Previous cesarean section, desires repeat. 2. Desires permanent sterilization. 3. Chronic hypertension.  PROCEDURE: 1. Repeat low transverse cesarean section. 2. Bilateral tubal ligation.  SURGEON:  Guy Sandifer. Alyssa Bauch, MD  ANESTHESIA:  Spinal.  ESTIMATED BLOOD LOSS:  500 mL.  SPECIMENS: 1. Placenta. 2. Bilateral fallopian tube segments, both to Pathology.  FINDINGS:  Viable female infant.  Apgars, arterial cord, pH, weight pending.  INDICATIONS AND CONSENT:  This patient is a 35 year old, G5, P2 with previous cesarean sections.  Details have been dictated in history and physical.  Repeat cesarean section with bilateral fallopian tube ligation has been discussed preoperatively.  Potential risks and complications were reviewed preoperatively including not limited to, infection, organ damage, bleeding requiring transfusion of blood products with HIV and hepatitis acquisition, DVT, PE, pneumonia, returned to the operating room.  Permanence of tubal ligation increased ectopic risk, failure rate, alternate methods have been discussed.  All questions were answered.  The patient states she understands and agrees.  DESCRIPTION OF PROCEDURE:  The patient was taken to the operating room where she was identified.  Spinal anesthetic was placed and she was placed in dorsal supine position with a 15-degree left lateral wedge. She was then prepped vaginally.  Foley catheter was placed, prepped abdominally per [facility] protocol.  Time-out was undertaken.  She was draped in a sterile fashion.  After testing for adequate spinal anesthesia, skin was entered through the previous Pfannenstiel scar and dissection was carried out in layers.  There was a thick scarification in the subcutaneous layer as well as the subfascial layer.  Peritoneal cavity was entered safely and extended superiorly and inferiorly.  There was some omental adhesions to the left of the peritoneal incision, but there were no closed loops observed and no bowel involved in the visible portion.  The vesicouterine peritoneum was taken down cephalolaterally. Bladder flap was developed.  The bladder blade was placed.  Uterus was incised in a low transverse manner.  The uterine cavity was entered bluntly with a hemostat.  Clear fluid was noted.  Uterine incision was extended cephalolaterally.  Vacuum extractor was then used to raise the vertex through the incision because of the scarification.  This was done without any pop-offs with gentle traction.  Remainder of the baby was then delivered.  Good cry and tone was noted.  Cord was clamped and cut. The baby was handed to awaiting pediatrics team.  Placenta was manually delivered and sent to Pathology.  Uterine cavity was clean.  Uterus was closed in 2 running locking imbricating layers of 0 Monocryl suture, which achieves good hemostasis.  After again checking with the patient that she wants her tubes tied, the left fallopian tube was identified from cornu to fimbria.  It was grasped in its mid ampullary portion with a Babcock clamp.  A knuckle of tissue was doubly ligated with 2 free ties of plain suture.  The intervening knuckle was then sharply resected.  Cautery was used to assure hemostasis.  Similar procedure was carried out on the right side.  Lavage was carried out until clear. Anterior peritoneum was then closed in running fashion with 0 Monocryl suture.  The anterior rectus  fascia was then closed in running fashion with 0 looped PDS.  After obtaining hemostasis, the subcutaneous layer was closed with interrupted plain and the skin was closed in subcuticular fashion with a 4-0 Vicryl on a Keith needle.  Benzoin and Steri-Strips were applied.  Pressure dressing was placed.  All counts were correct.  The patient was taken to recovery room in stable condition.     Guy Sandifer Henderson Cloud, M.D.     JET/MEDQ  D:  09/17/2015  T:  09/17/2015  Job:  [id number]

## 2015-09-17 NOTE — Lactation Note (Signed)
This note was copied from a baby's chart. Lactation Consultation Note Initial visit at 3 hours of age  Mom is recovering from a c/s and very sleepy.  Mom plans to breast and alternate with formula in a bottle due to being so tired.  Mom is not experienced with breastfeeding older children.  Explained to mom how bottle and formula feeding can affect milk supply and feedings.  Encouraged mom to stimulate breasts 8 times daily and ask for DEBP to use if needed. Mom resistant to working on hand expression due to sleepiness, but does show LC how to hand express a few drops of colostrum from left breast. [facility] LC resources given and discussed.  Early newborn behavior discussed.   Mom to call for assist as needed.     Patient Name: [name] [id number] Date: 09/17/2015 Reason for consult: Initial assessment   Maternal Data Has patient been taught Hand Expression?: Yes Does the patient have breastfeeding experience prior to this delivery?: No  Feeding Feeding Type: Breast Fed Length of feed: 30 min  LATCH Score/Interventions Latch: Grasps breast easily, tongue down, lips flanged, rhythmical sucking.  Audible Swallowing: Spontaneous and intermittent  Type of Nipple: Everted at rest and after stimulation  Comfort (Breast/Nipple): Soft / non-tender     Hold (Positioning): Assistance needed to correctly position infant at breast and maintain latch. Intervention(s): Breastfeeding basics reviewed  LATCH Score: 9  Lactation Tools Discussed/Used     Consult Status Consult Status: Follow-up Date: 09/18/15 Follow-up type: In-patient    [name], Arvella Merles 09/17/2015, 5:27 PM

## 2015-09-17 NOTE — Anesthesia Postprocedure Evaluation (Signed)
Anesthesia Post Note  Patient: [name]  Procedure(s) Performed: Procedure(s) (LRB): REPEAT CESAREAN SECTION WITH BILATERAL TUBAL LIGATION (Bilateral)  Patient location during evaluation: PACU Anesthesia Type: Spinal Level of consciousness: oriented and awake and alert Pain management: pain level controlled Vital Signs Assessment: post-procedure vital signs reviewed and stable Respiratory status: spontaneous breathing, respiratory function stable and patient connected to nasal cannula oxygen Cardiovascular status: blood pressure returned to baseline and stable Postop Assessment: no headache and no backache Anesthetic complications: no    Last Vitals:  Filed Vitals:   09/17/15 1507 09/17/15 1515  BP:  167/94  Pulse: 70 72  Temp:    Resp: 17 14    Last Pain:  Filed Vitals:   09/17/15 1531  PainSc: 0-No pain                 Phillips Grout

## 2015-09-17 NOTE — Anesthesia Procedure Notes (Addendum)
Spinal Patient location during procedure: OR Staffing Anesthesiologist: Emilynn Srinivasan Performed by: anesthesiologist  Preanesthetic Checklist Completed: patient identified, site marked, surgical consent, pre-op evaluation, timeout performed, IV checked, risks and benefits discussed and monitors and equipment checked Spinal Block Patient position: sitting Prep: ChloraPrep Patient monitoring: heart rate, continuous pulse ox and blood pressure Approach: right paramedian Location: L3-4 Injection technique: single-shot Needle Needle type: Sprotte  Needle gauge: 24 G Needle length: 9 cm Additional Notes Expiration date of kit checked and confirmed. Patient tolerated procedure well, without complications.     

## 2015-09-18 ENCOUNTER — Encounter (HOSPITAL_COMMUNITY): Payer: Self-pay | Admitting: Obstetrics and Gynecology

## 2015-09-18 LAB — CBC
HEMATOCRIT: 22.3 % — AB (ref 36.0–46.0)
HEMOGLOBIN: 7 g/dL — AB (ref 12.0–15.0)
MCH: 21.2 pg — AB (ref 26.0–34.0)
MCHC: 31.4 g/dL (ref 30.0–36.0)
MCV: 67.6 fL — ABNORMAL LOW (ref 78.0–100.0)
Platelets: 191 10*3/uL (ref 150–400)
RBC: 3.3 MIL/uL — ABNORMAL LOW (ref 3.87–5.11)
RDW: 15.2 % (ref 11.5–15.5)
WBC: 11.2 10*3/uL — ABNORMAL HIGH (ref 4.0–10.5)

## 2015-09-18 MED ORDER — FERROUS SULFATE 325 (65 FE) MG PO TABS
325.0000 mg | ORAL_TABLET | Freq: Three times a day (TID) | ORAL | Status: DC
  Administered 2015-09-18 – 2015-09-21 (×9): 325 mg via ORAL
  Filled 2015-09-18 (×9): qty 1

## 2015-09-18 NOTE — Progress Notes (Signed)
Subjective: Postpartum Day 1: Cesarean Delivery Patient reports tolerating PO.  Complains of shooting pain to shoulder. Denies SOB or dizziness  Objective: Vital signs in last 24 hours: Temp:  [97.7 F (36.5 C)-98.4 F (36.9 C)] 98.1 F (36.7 C) (03/03 0630) Pulse Rate:  [57-95] 87 (03/03 0630) Resp:  [11-22] 18 (03/03 0630) BP: (100-168)/(63-100) 129/63 mmHg (03/03 0630) SpO2:  [97 %-100 %] 98 % (03/03 0630)  Physical Exam:  General: alert and cooperative Lochia: appropriate Uterine Fundus: firm Incision: abd pressure dressing CDI DVT Evaluation: No evidence of DVT seen on physical exam. Negative Homan's sign. No cords or calf tenderness. No significant calf/ankle edema.   Recent Labs  09/15/15 1015 09/18/15 0216  HGB 9.6* 7.0*  HCT 30.7* 22.3*    Assessment/Plan: Status post Cesarean section. Postoperative course complicated by anemia  Cbc in am Feso4.  CURTIS,CAROL G 09/18/2015, 8:04 AM

## 2015-09-18 NOTE — Progress Notes (Signed)
Dr. Marcelle OverlieHolland called to report cbc results. HgB dropped from 9.6 to 7.0. Pt reports no more ringing in her ears, but still pain with deep breathing and dyspnea on exertion. No new orders at this time.

## 2015-09-18 NOTE — Lactation Note (Signed)
This note was copied from a baby's chart. Lactation Consultation Note  Patient Name: [name] ZOXWR'UToday's Date: 09/18/2015 Reason for consult: Follow-up assessment Baby at 27 hr of life and mom has switched to formula.   Maternal Data    Feeding Feeding Type: Bottle Fed - Formula  LATCH Score/Interventions                      Lactation Tools Discussed/Used     Consult Status Consult Status: Complete    [name] 09/18/2015, 4:39 PM

## 2015-09-18 NOTE — Progress Notes (Signed)
Dr. Marcelle OverlieHolland paged after pt reported a ringing in her ears and pain with deep breathing. BP 100/63, pulse ox 100%, bleeding small to scant. CBC ordered stat.

## 2015-09-18 NOTE — Anesthesia Postprocedure Evaluation (Signed)
Anesthesia Post Note  Patient: [name]  Procedure(s) Performed: Procedure(s) (LRB): REPEAT CESAREAN SECTION WITH BILATERAL TUBAL LIGATION (Bilateral)  Patient location during evaluation: Mother Baby Anesthesia Type: Spinal Level of consciousness: awake Pain management: pain level not controlled (10 pain level;pt said she didnt take anything;encouraged to let nurse know whe needed pain meds and nurse called) Vital Signs Assessment: post-procedure vital signs reviewed and stable Respiratory status: spontaneous breathing Cardiovascular status: stable Postop Assessment: no headache, no backache, epidural receding and patient able to bend at knees Anesthetic complications: no    Last Vitals:  Filed Vitals:   09/18/15 0205 09/18/15 0630  BP: 112/68 129/63  Pulse: 90 87  Temp:  36.7 C  Resp:  18    Last Pain:  Filed Vitals:   09/18/15 0844  PainSc: 4                  Edison PaceWILKERSON,Berry Godsey

## 2015-09-18 NOTE — Addendum Note (Signed)
Addendum  created 09/18/15 [id number] by Earmon PhoenixValerie P Tayvion Lauder, CRNA   Modules edited: Clinical Notes   Clinical Notes:  File: [id number]

## 2015-09-19 ENCOUNTER — Encounter (HOSPITAL_COMMUNITY): Payer: Self-pay | Admitting: Anesthesiology

## 2015-09-19 LAB — PREPARE RBC (CROSSMATCH)

## 2015-09-19 LAB — TYPE AND SCREEN
ABO/RH(D): O POS
Antibody Screen: NEGATIVE
Unit division: 0
Unit division: 0

## 2015-09-19 LAB — CBC
HCT: 17.5 % — ABNORMAL LOW (ref 36.0–46.0)
Hemoglobin: 5.4 g/dL — CL (ref 12.0–15.0)
MCH: 21.1 pg — AB (ref 26.0–34.0)
MCHC: 30.9 g/dL (ref 30.0–36.0)
MCV: 68.4 fL — AB (ref 78.0–100.0)
PLATELETS: 172 10*3/uL (ref 150–400)
RBC: 2.56 MIL/uL — ABNORMAL LOW (ref 3.87–5.11)
RDW: 15.6 % — AB (ref 11.5–15.5)
WBC: 11.4 10*3/uL — AB (ref 4.0–10.5)

## 2015-09-19 MED ORDER — DIPHENHYDRAMINE HCL 25 MG PO CAPS
25.0000 mg | ORAL_CAPSULE | Freq: Once | ORAL | Status: AC
Start: 2015-09-19 — End: 2015-09-19
  Administered 2015-09-19: 25 mg via ORAL
  Filled 2015-09-19: qty 1

## 2015-09-19 MED ORDER — SODIUM CHLORIDE 0.9 % IV SOLN
Freq: Once | INTRAVENOUS | Status: AC
  Administered 2015-09-19: 14:00:00 via INTRAVENOUS

## 2015-09-19 MED ORDER — ACETAMINOPHEN 325 MG PO TABS
650.0000 mg | ORAL_TABLET | Freq: Once | ORAL | Status: AC
Start: 2015-09-19 — End: 2015-09-19
  Administered 2015-09-19: 650 mg via ORAL
  Filled 2015-09-19: qty 2

## 2015-09-19 NOTE — Progress Notes (Signed)
Dr. Langston MaskerMorris notified of critical hemglobin of 5.4. No new orders at this time.   CRITICAL VALUE ALERT  Critical value received:  Hgb 5.4  Date of notification:  09/19/15 Time of notification:  n  Critical value read back:Yes.    Nurse who received alert:  Mellissa Conley   MD notified (1st page):  Morris  Time of first page:  0645  MD notified (2nd page):n/a  Time of second page:n/a  Responding MD:  Langston MaskerMorris  Time MD responded:  [phone]

## 2015-09-19 NOTE — Progress Notes (Signed)
Subjective: Postpartum Day 2: Cesarean Delivery Patient reports tolerating PO, + flatus and no problems voiding.  Reports no chest pain or dizziness on standing.  Does has some SOB with activity.  Scant lochia.  Reports left flank pain radiating anteriorly which resembles the pain she has had previously with kidney stones.  Desires circ.  Objective: Vital signs in last 24 hours: Temp:  [97.7 F (36.5 C)-98.4 F (36.9 C)] 98.2 F (36.8 C) (03/03 1827) Pulse Rate:  [73-96] 75 (03/04 0635) Resp:  [18] 18 (03/04 0635) BP: (124-139)/(53-75) 136/65 mmHg (03/04 0635) SpO2:  [100 %] 100 % (03/03 1030)  Physical Exam:  General: alert, cooperative and appears stated age Lochia: appropriate Uterine Fundus: firm Incision: healing well, no significant drainage, no dehiscence DVT Evaluation: No evidence of DVT seen on physical exam. Negative Homan's sign. No cords or calf tenderness.   Recent Labs  09/18/15 0216 09/19/15 0547  HGB 7.0* 5.4*  HCT 22.3* 17.5*    Assessment/Plan: Status post Cesarean section. Doing well postoperatively.  Continue current care. Acute blood loss anemia on chronic anemia-FeSO4.  As the patient is symptomatic with SOB, recommend transfusion of 2uPRBCs.  She is informed of the risk of transfusion reaction and viral transmission.  She is undecided at this time and will consider.  Will follow serial hgb regardless to ensure no acute change.   Desires circ-Counseled for circ including risk of bleeding, infection, scarring.  All questions were answered and we will proceed.   [name] 09/19/2015, 9:24 AM

## 2015-09-20 LAB — TYPE AND SCREEN
ABO/RH(D): O POS
Antibody Screen: NEGATIVE
Unit division: 0
Unit division: 0

## 2015-09-20 LAB — CBC
HCT: 24 % — ABNORMAL LOW (ref 36.0–46.0)
Hemoglobin: 7.6 g/dL — ABNORMAL LOW (ref 12.0–15.0)
MCH: 22.8 pg — ABNORMAL LOW (ref 26.0–34.0)
MCHC: 31.7 g/dL (ref 30.0–36.0)
MCV: 72.1 fL — AB (ref 78.0–100.0)
PLATELETS: 190 10*3/uL (ref 150–400)
RBC: 3.33 MIL/uL — AB (ref 3.87–5.11)
RDW: 18.6 % — AB (ref 11.5–15.5)
WBC: 12.7 10*3/uL — AB (ref 4.0–10.5)

## 2015-09-20 MED ORDER — HYDROCHLOROTHIAZIDE 25 MG PO TABS
25.0000 mg | ORAL_TABLET | Freq: Every day | ORAL | Status: DC
  Administered 2015-09-20 – 2015-09-21 (×2): 25 mg via ORAL
  Filled 2015-09-20 (×2): qty 1

## 2015-09-20 MED ORDER — IBUPROFEN 600 MG PO TABS: 600.0000 mg | ORAL_TABLET | Freq: Four times a day (QID) | ORAL | Status: AC

## 2015-09-20 MED ORDER — FERROUS SULFATE 325 (65 FE) MG PO TABS: 325.0000 mg | ORAL_TABLET | Freq: Three times a day (TID) | ORAL | Status: AC

## 2015-09-20 MED ORDER — OXYCODONE-ACETAMINOPHEN 5-325 MG PO TABS: 1.0000 | ORAL_TABLET | ORAL | Status: AC | PRN

## 2015-09-20 MED ORDER — FAMOTIDINE 20 MG PO TABS
40.0000 mg | ORAL_TABLET | Freq: Every day | ORAL | Status: DC | PRN
  Administered 2015-09-20: 40 mg via ORAL
  Filled 2015-09-20: qty 2

## 2015-09-20 NOTE — Progress Notes (Signed)
In to see patient and evaluate for possible discharge.  The patient reports left low back pain radiating anteriorly; worse with urination.  She states it feels like her typical kidney stones.  She requests increasing to 2 tabs percocet like yesterday rather than 1 which she has taken today.  Also, the patient has decided to not breastfeed and we will replace on pre-pregnancy med of HCTZ.  She denies SOB, dizziness on standing and HA.  Will keep tonight and reevaluate tomorrow morning.  Will have a low threshold to do pelvic CT if pain worsens or does not improve with time.  Mitchel HonourMegan Deryl Giroux, DO

## 2015-09-20 NOTE — Progress Notes (Signed)
Subjective: Postpartum Day 3: Cesarean Delivery Patient reports tolerating PO, + flatus and no problems voiding.    Objective: Vital signs in last 24 hours: Temp:  [97.5 F (36.4 C)-98.5 F (36.9 C)] 97.5 F (36.4 C) (03/05 0550) Pulse Rate:  [77-106] 79 (03/05 0550) Resp:  [18-20] 18 (03/05 0550) BP: (133-148)/(51-83) 139/79 mmHg (03/05 0550) SpO2:  [100 %] 100 % (03/04 1750)  Physical Exam:  General: alert, cooperative and appears stated age 61Lochia: appropriate Uterine Fundus: firm Incision: healing well, no significant drainage, no dehiscence DVT Evaluation: No evidence of DVT seen on physical exam. Negative Homan's sign. No cords or calf tenderness.   Recent Labs  09/19/15 0547 09/20/15 0555  HGB 5.4* 7.6*  HCT 17.5* 24.0*    Assessment/Plan: Status post Cesarean section. Doing well postoperatively.  Discharge home with standard precautions and return to clinic in 4-6 weeks. Acute blood loss on chronic anemia-received 2u PRBCs yesterday with appropriate rise in hemoglobin.  Continue FeSO4.  [name] 09/20/2015, 9:10 AM

## 2015-09-20 NOTE — Progress Notes (Signed)
Dr Langston Maskermorris called - made aware that pt's b/p 169/78  - pt c/o being jittery and currently having a nosebleed - order to have pt rest and recheck b/p in 1 hr

## 2015-09-20 NOTE — Discharge Summary (Signed)
Obstetric Discharge Summary Reason for Admission: cesarean section Prenatal Procedures: none Intrapartum Procedures: cesarean: low cervical, transverse and tubal ligation Postpartum Procedures: transfusion 2u PRBCs Complications-Operative and Postpartum: none HEMOGLOBIN  Date Value Ref Range Status  09/20/2015 7.6* 12.0 - 15.0 g/dL Final    Comment:    DELTA CHECK NOTED REPEATED TO VERIFY POST TRANSFUSION SPECIMEN   03/26/2015 11.5* 12.2 - 16.2 g/dL Final   HCT  Date Value Ref Range Status  09/20/2015 24.0* 36.0 - 46.0 % Final   HCT, POC  Date Value Ref Range Status  03/26/2015 36.9* 37.7 - 47.9 % Final    Physical Exam:  General: alert, cooperative and appears stated age 66Lochia: appropriate Uterine Fundus: firm Incision: healing well, no significant drainage, no dehiscence DVT Evaluation: No evidence of DVT seen on physical exam. Negative Homan's sign. No cords or calf tenderness.  Discharge Diagnoses: Term Pregnancy-delivered  Discharge Information: Date: 09/20/2015 Activity: pelvic rest Diet: routine Medications: PNV, Ibuprofen, Iron and Percocet Condition: stable Instructions: refer to practice specific booklet Discharge to: home   Newborn Data: Live born female  Birth Weight: 7 lb 3.5 oz (3275 g) APGAR: 8, 9  Home with mother.  Maclain Cohron 09/20/2015, 9:16 AM

## 2015-09-20 NOTE — Discharge Instructions (Signed)
Call MD for T>100.4, heavy vaginal bleeding, severe abdominal pain, intractable nausea and/or vomiting, or respiratory distress.  Call office to schedule incision and hemoglobin check in 1 week.  No driving while taking narcotics.  Pelvic rest x 6 weeks. °

## 2015-09-21 LAB — COMPREHENSIVE METABOLIC PANEL
ALK PHOS: 117 U/L (ref 38–126)
ALT: 26 U/L (ref 14–54)
AST: 46 U/L — ABNORMAL HIGH (ref 15–41)
Albumin: 2.4 g/dL — ABNORMAL LOW (ref 3.5–5.0)
Anion gap: 5 (ref 5–15)
BUN: 14 mg/dL (ref 6–20)
CALCIUM: 8.5 mg/dL — AB (ref 8.9–10.3)
CO2: 24 mmol/L (ref 22–32)
CREATININE: 0.91 mg/dL (ref 0.44–1.00)
Chloride: 107 mmol/L (ref 101–111)
Glucose, Bld: 94 mg/dL (ref 65–99)
Potassium: 3.8 mmol/L (ref 3.5–5.1)
Sodium: 136 mmol/L (ref 135–145)
TOTAL PROTEIN: 5.4 g/dL — AB (ref 6.5–8.1)
Total Bilirubin: 0.7 mg/dL (ref 0.3–1.2)

## 2015-09-21 LAB — CBC
HCT: 24.5 % — ABNORMAL LOW (ref 36.0–46.0)
HEMOGLOBIN: 7.9 g/dL — AB (ref 12.0–15.0)
MCH: 23.3 pg — AB (ref 26.0–34.0)
MCHC: 32.2 g/dL (ref 30.0–36.0)
MCV: 72.3 fL — AB (ref 78.0–100.0)
Platelets: 216 10*3/uL (ref 150–400)
RBC: 3.39 MIL/uL — AB (ref 3.87–5.11)
RDW: 18.8 % — ABNORMAL HIGH (ref 11.5–15.5)
WBC: 10.7 10*3/uL — ABNORMAL HIGH (ref 4.0–10.5)

## 2015-09-21 MED ORDER — HYDROCHLOROTHIAZIDE 25 MG PO TABS: 25.0000 mg | ORAL_TABLET | Freq: Every day | ORAL | Status: AC

## 2015-09-21 NOTE — Progress Notes (Signed)
Subjective: Postpartum Day 4: Cesarean Delivery Patient reports incisional pain, tolerating PO, + flatus and no problems voiding.  Denies dizziness. LLQ pain continues intermittently. Has not had BM since last Wednesday. No flank pain. Desires discharge   Objective: Vital signs in last 24 hours: Temp:  [97.6 F (36.4 C)-98.3 F (36.8 C)] 98.3 F (36.8 C) (03/06 0500) Pulse Rate:  [76-98] 94 (03/06 0500) Resp:  [18] 18 (03/06 0500) BP: (135-169)/(74-84) 144/79 mmHg (03/06 0500) SpO2:  [99 %] 99 % (03/06 0500)  Physical Exam:  General: alert and cooperative Lochia: appropriate Uterine Fundus: firm Incision: healing well DVT Evaluation: No evidence of DVT seen on physical exam. Negative Homan's sign. No cords or calf tenderness. Calf/Ankle edema is present.  Negative CVAT   Recent Labs  09/20/15 0555 09/21/15 0526  HGB 7.6* 7.9*  HCT 24.0* 24.5*    Assessment/Plan: Status post Cesarean section. Postoperative course complicated by anemia and LLQ pain  Discharge home with standard precautions and return to clinic in 3 days.  CURTIS,CAROL G 09/21/2015, 8:22 AM

## 2017-08-07 IMAGING — CR DG CHEST 2V
2 series · 2 of 2 positions shown · non-contrast
Comparison: None.

CLINICAL DATA: Elevated blood pressure. Shortness of breath. 25
weeks pregnant.

EXAM:
CHEST  2 VIEW

[chest pa]
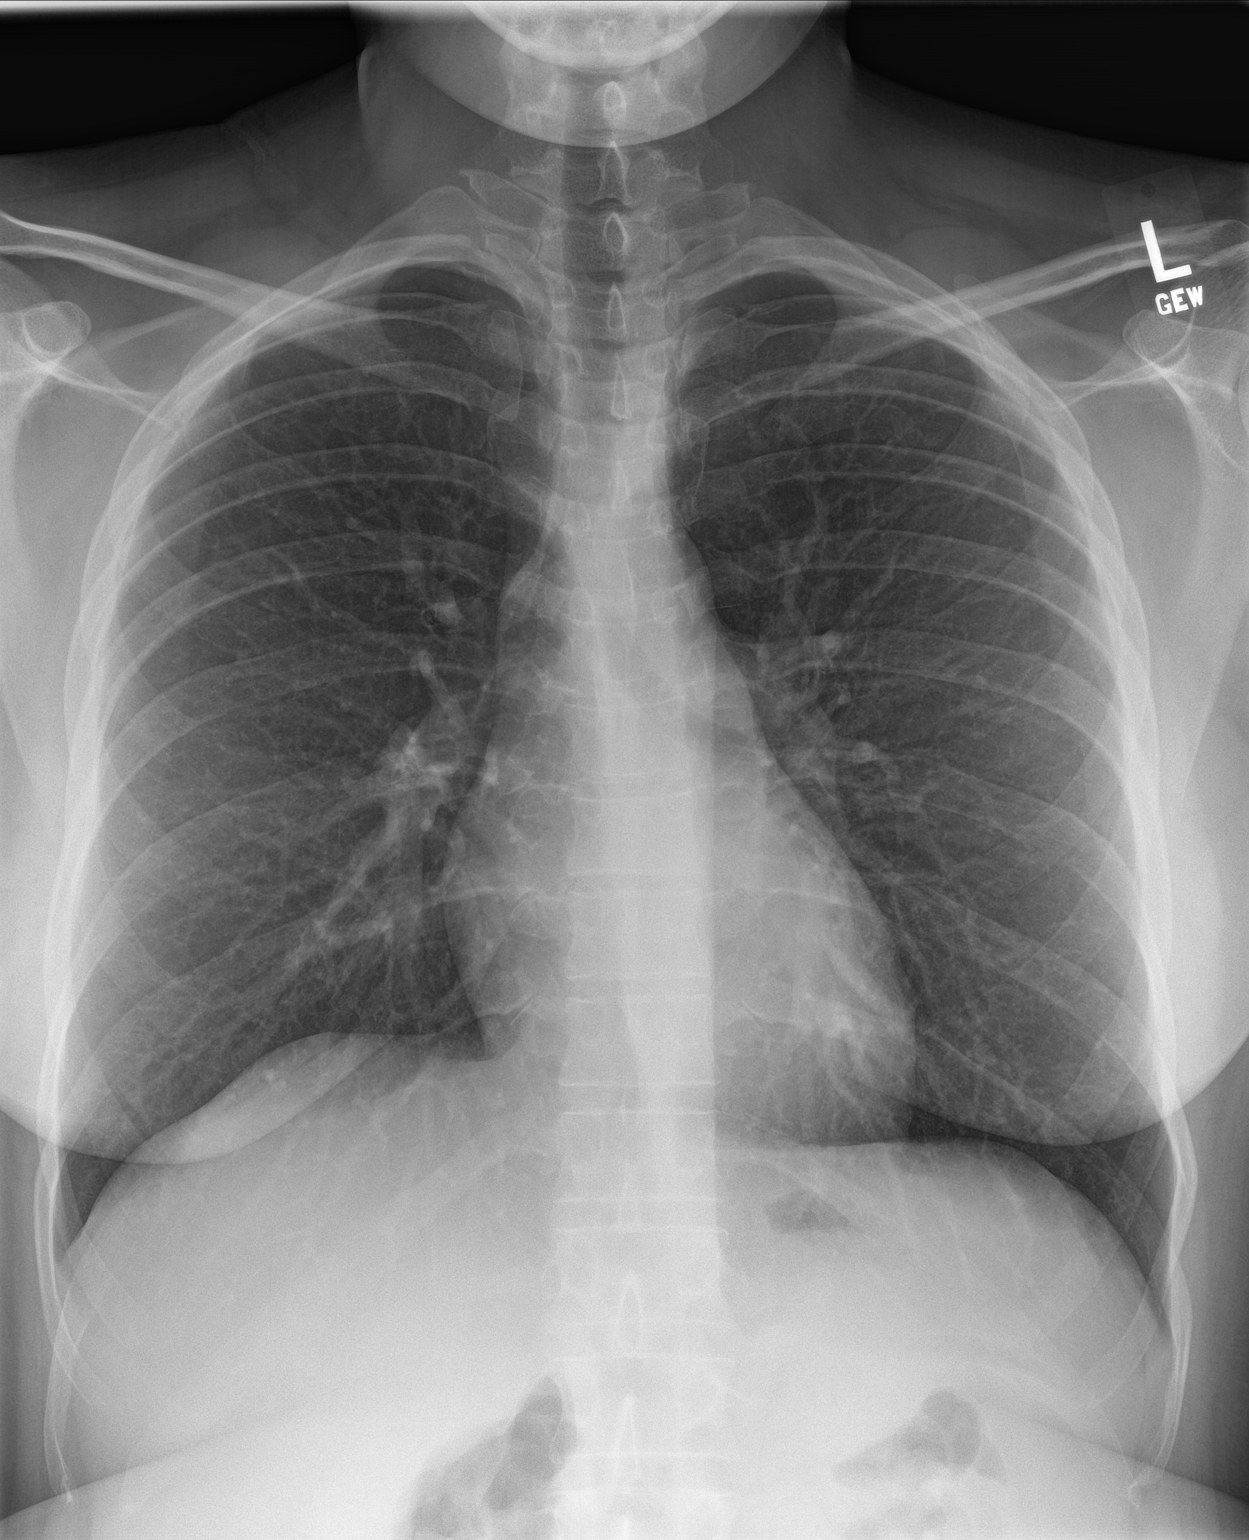

[chest lat]
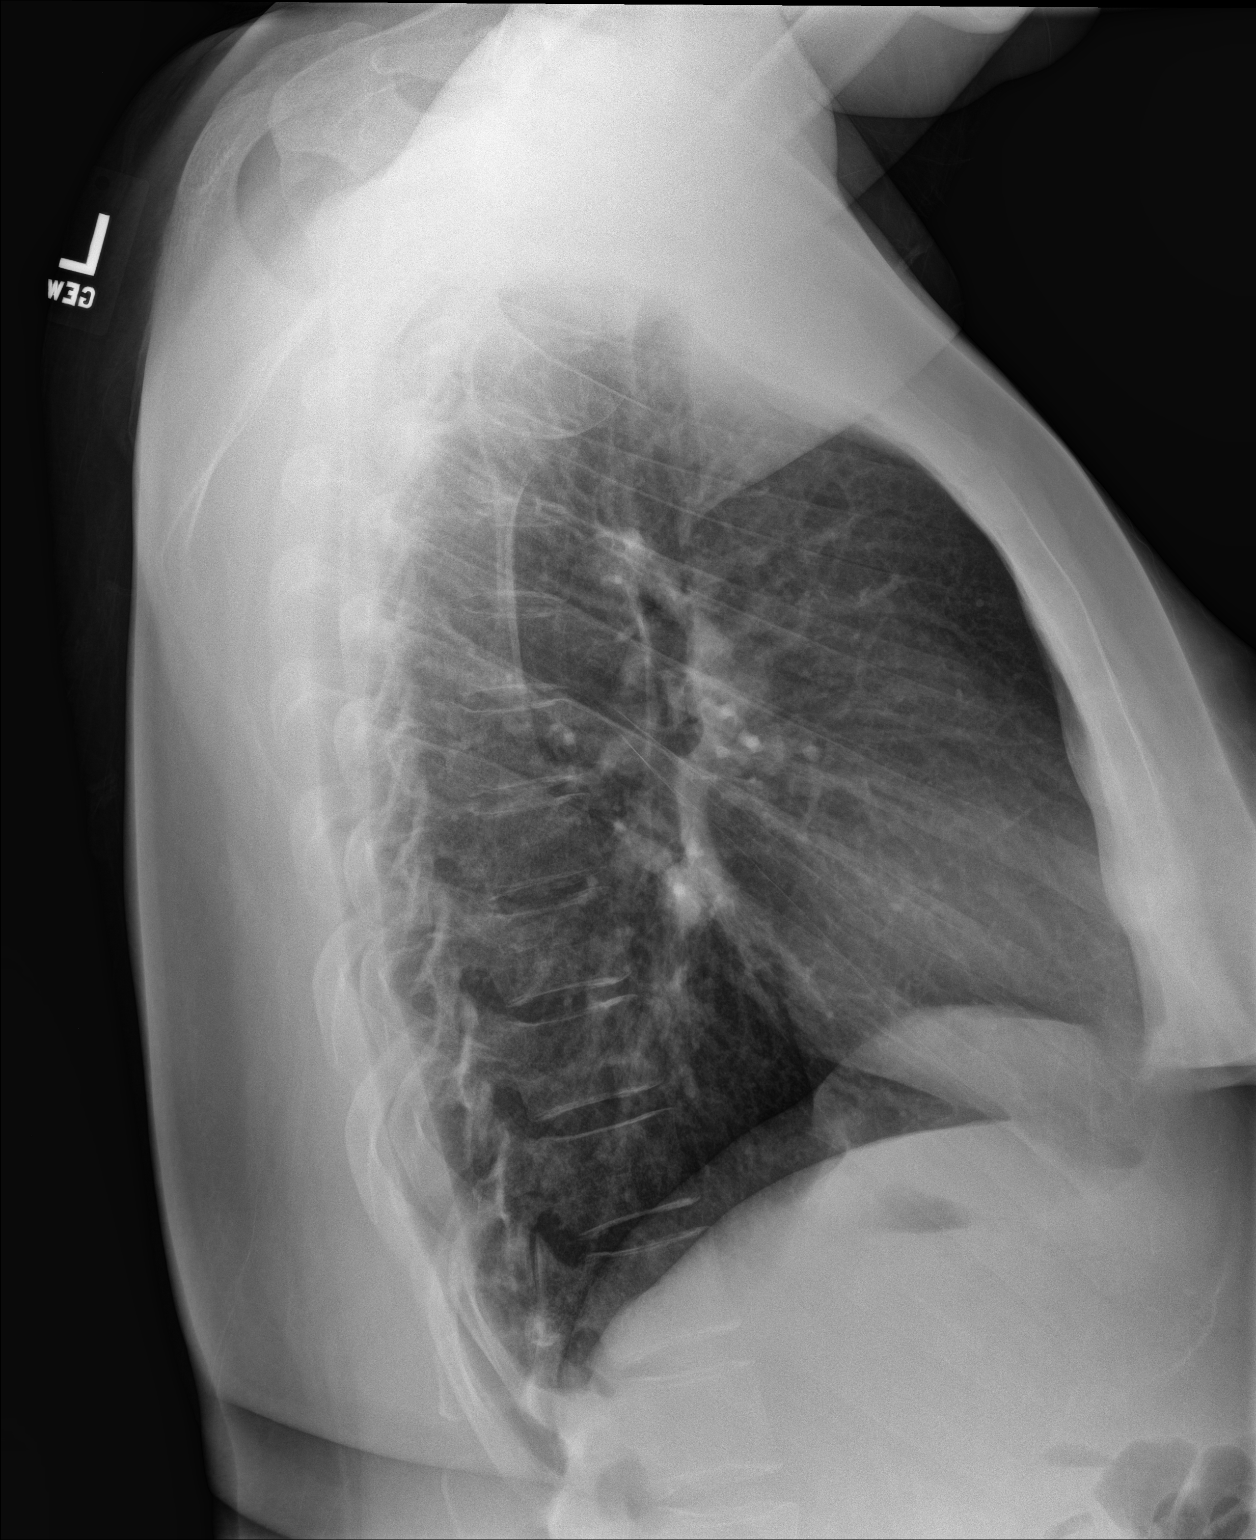

[2 of 2 positions shown; findings below may reference images not displayed]

FINDINGS: The heart size and mediastinal contours are within normal limits.
Both lungs are clear. The visualized skeletal structures are
unremarkable.
IMPRESSION: No active cardiopulmonary disease.
